# Patient Record
Sex: Female | Born: 1980 | Race: White | Hispanic: No | Marital: Single | Smoking: Current every day smoker
Health system: Southern US, Community
[De-identification: ages and names within clinical notes are randomized; demographics above are authoritative.]

## PROBLEM LIST (undated history)

## (undated) DIAGNOSIS — I509 Heart failure, unspecified: Secondary | ICD-10-CM

## (undated) DIAGNOSIS — Z9114 Patient's other noncompliance with medication regimen: Secondary | ICD-10-CM

## (undated) DIAGNOSIS — Z91148 Patient's other noncompliance with medication regimen for other reason: Secondary | ICD-10-CM

## (undated) DIAGNOSIS — I10 Essential (primary) hypertension: Secondary | ICD-10-CM

## (undated) DIAGNOSIS — D649 Anemia, unspecified: Secondary | ICD-10-CM

## (undated) DIAGNOSIS — J449 Chronic obstructive pulmonary disease, unspecified: Secondary | ICD-10-CM

## (undated) DIAGNOSIS — L309 Dermatitis, unspecified: Secondary | ICD-10-CM

## (undated) HISTORY — DX: Heart failure, unspecified: I50.9

## (undated) HISTORY — DX: Chronic obstructive pulmonary disease, unspecified: J44.9

## (undated) HISTORY — PX: TYMPANOSTOMY TUBE PLACEMENT: SHX32

---

## 2001-02-28 ENCOUNTER — Inpatient Hospital Stay (HOSPITAL_COMMUNITY): Admission: EM | Admit: 2001-02-28 | Discharge: 2001-03-05 | Payer: Self-pay | Admitting: Psychiatry

## 2004-06-29 ENCOUNTER — Inpatient Hospital Stay: Payer: Self-pay

## 2006-01-08 ENCOUNTER — Emergency Department: Payer: Self-pay | Admitting: Emergency Medicine

## 2006-03-21 ENCOUNTER — Emergency Department: Payer: Self-pay | Admitting: General Practice

## 2006-03-22 ENCOUNTER — Emergency Department: Payer: Self-pay | Admitting: General Practice

## 2009-03-08 ENCOUNTER — Emergency Department: Payer: Self-pay | Admitting: Emergency Medicine

## 2011-08-09 ENCOUNTER — Emergency Department: Payer: Self-pay | Admitting: Internal Medicine

## 2016-02-13 ENCOUNTER — Emergency Department
Admission: EM | Admit: 2016-02-13 | Discharge: 2016-02-13 | Disposition: A | Discharge status: Home / Self Care | Payer: Self-pay | Attending: Emergency Medicine | Admitting: Emergency Medicine

## 2016-02-13 ENCOUNTER — Encounter: Payer: Self-pay | Admitting: Emergency Medicine

## 2016-02-13 ENCOUNTER — Emergency Department: Payer: Self-pay

## 2016-02-13 DIAGNOSIS — I10 Essential (primary) hypertension: Secondary | ICD-10-CM | POA: Insufficient documentation

## 2016-02-13 DIAGNOSIS — F17200 Nicotine dependence, unspecified, uncomplicated: Secondary | ICD-10-CM | POA: Insufficient documentation

## 2016-02-13 DIAGNOSIS — J988 Other specified respiratory disorders: Secondary | ICD-10-CM

## 2016-02-13 DIAGNOSIS — J189 Pneumonia, unspecified organism: Secondary | ICD-10-CM | POA: Insufficient documentation

## 2016-02-13 DIAGNOSIS — R062 Wheezing: Secondary | ICD-10-CM | POA: Insufficient documentation

## 2016-02-13 HISTORY — DX: Essential (primary) hypertension: I10

## 2016-02-13 MED ORDER — ALBUTEROL SULFATE HFA 108 (90 BASE) MCG/ACT IN AERS: INHALATION_SPRAY | RESPIRATORY_TRACT | Status: DC

## 2016-02-13 MED ORDER — IPRATROPIUM-ALBUTEROL 0.5-2.5 (3) MG/3ML IN SOLN
3.0000 mL | Freq: Once | RESPIRATORY_TRACT | Status: AC
  Administered 2016-02-13: 3 mL via RESPIRATORY_TRACT
  Filled 2016-02-13: qty 3

## 2016-02-13 MED ORDER — SODIUM CHLORIDE 0.9 % IV BOLUS (SEPSIS)
1000.0000 mL | INTRAVENOUS | Status: AC
  Administered 2016-02-13: 1000 mL via INTRAVENOUS

## 2016-02-13 MED ORDER — LEVOFLOXACIN 750 MG PO TABS: 750.0000 mg | ORAL_TABLET | Freq: Every day | ORAL | Status: AC

## 2016-02-13 NOTE — ED Provider Notes (Signed)
[facility] Emergency Department Provider Note  ____________________________________________  Time seen: Approximately 12:54 PM  I have reviewed the triage vital signs and the nursing notes.   HISTORY  Chief Complaint Fever and Nasal Congestion    HPI Katrina Stout is a 35 y.o. female with a history of daily tobacco use and obesity who presents with several days of nasal congestion, runny nose, mild shortness of breath, worsening cough, and low-grade fever.  She states that the symptoms are moderate in severity and have been gradually getting worse over time.  Nothing makes them better and exertion makes it worse. She has noticed some wheezing at times.  She denies headache, chest pain, abdominal pain, nausea, vomiting, diarrhea, dysuria.  She does not have a formal diagnosis of COPD nor asthma but she also comments that she does not have a regular doctor, has no insurance, and no money to purchase medications.  She is in no acute distress upon arrival although she is tachycardic in the 110s.   Past Medical History  Diagnosis Date  . Hypertension     There are no active problems to display for this patient.   History reviewed. No pertinent past surgical history.  Current Outpatient Rx  Name  Route  Sig  Dispense  Refill  .           Marland Kitchen             Allergies Codeine  History reviewed. No pertinent family history.  Social History Social History  Substance Use Topics  . Smoking status: Current Every Day Smoker  . Smokeless tobacco: None  . Alcohol Use: Yes    Review of Systems Constitutional: low grade fever Eyes: No visual changes. ENT: No sore throat. Cardiovascular: Denies chest pain. Respiratory: Mild shortness of breath and wheezing, cough Gastrointestinal: No abdominal pain.  No nausea, no vomiting.  No diarrhea.  No constipation. Genitourinary: Negative for dysuria. Musculoskeletal: Negative for back pain. Skin: Negative for  rash. Neurological: Negative for headaches, focal weakness or numbness.  10-point ROS otherwise negative.  ____________________________________________   PHYSICAL EXAM:  VITAL SIGNS: ED Triage Vitals  Enc Vitals Group     BP 02/13/16 1040 180/118 mmHg     Pulse Rate 02/13/16 1040 115     Resp 02/13/16 1040 20     Temp 02/13/16 1040 100.8 F (38.2 C)     Temp Source 02/13/16 1040 Oral     SpO2 02/13/16 1040 94 %     Weight 02/13/16 1040 240 lb (108.863 kg)     Height 02/13/16 1040  (1.676 m)     Head Cir --      Peak Flow --      Pain Score 02/13/16 1043 5     Pain Loc --      Pain Edu? --      Excl. in GC? --     Constitutional: Alert and oriented. Well appearing and in no acute distress. Eyes: Conjunctivae are normal. PERRL. EOMI. Head: Atraumatic. Nose: No congestion/rhinnorhea. Mouth/Throat: Mucous membranes are moist.  Oropharynx non-erythematous. Neck: No stridor.  No meningeal signs.   Cardiovascular: Mild tachycardia, regular rhythm. Good peripheral circulation. Grossly normal heart sounds.   Respiratory: Normal respiratory effort.  No retractions. Crackles in bases.  Mild expiratory wheezing. Gastrointestinal: Soft and nontender. No distention.  Musculoskeletal: No lower extremity tenderness nor edema. No gross deformities of extremities. Neurologic:  Normal speech and language. No gross focal neurologic deficits are  appreciated.  Skin:  Skin is warm, dry and intact. No rash noted. Psychiatric: Mood and affect are normal. Speech and behavior are normal.  ____________________________________________   LABS (all labs ordered are listed, but only abnormal results are displayed)  Labs Reviewed  POC URINE PREG, ED   ____________________________________________  EKG  None ____________________________________________  RADIOLOGY   Dg Chest 2 View  02/13/2016  CLINICAL DATA:  Shortness of breath and fever EXAM: CHEST  2 VIEW COMPARISON:  08/09/2011  FINDINGS: Cardiac shadow is stable. The lungs are well aerated bilaterally. Patchy infiltrates are noted within the bases bilaterally projecting in the lingula and right middle lobe. No bony abnormality is seen. IMPRESSION: Patchy infiltrates in the bases bilaterally. Electronically Signed   By: Alcide CleverMark  Lukens M.D.   On: 02/13/2016 12:46    ____________________________________________   PROCEDURES  Procedure(s) performed: None  Critical Care performed: No ____________________________________________   INITIAL IMPRESSION / ASSESSMENT AND PLAN / ED COURSE  Pertinent labs & imaging results that were available during my care of the patient were reviewed by me and considered in my medical decision making (see chart for details).  CAP with wheezing.  No acute distress, no hypoxemia (though in the low 90s).  Felt better after duoneb.  Considered doxycycline vs Levaquin, but with [employer] coupon, she can get a much better deal on the Levaquin, and I want her to have broader spectrum coverage then azithromycin given that she has multilobar involvement.  I offered her a first dose of her Levaquin but explained that this would like to be fairly pricey or she can discuss pickup the prescription from the pharmacy since it is only 1:00 in the afternoon and she prefers the latter.  I gave strict return precautions.  There is no indication for labs at this time because he has no chest pain, does have respiratory infectious symptoms, and has patchy infiltrates on her chest x-ray as well as a heavy tobacco history.  I expect that she will get better soon but she knows to come back if she does not.  She does state that she has a history of hypertension but does not take any medications and I also encouraged her to follow up with the total free number provided on her AVS so that she can establish a primary care doctor or [facility]  directly.   ____________________________________________  FINAL CLINICAL IMPRESSION(S) / ED DIAGNOSES  Final diagnoses:  Community acquired pneumonia  Wheezing-associated respiratory infection (WARI)     MEDICATIONS GIVEN DURING THIS VISIT:  Medications  sodium chloride 0.9 % bolus 1,000 mL (1,000 mLs Intravenous New Bag/Given 02/13/16 1212)     NEW OUTPATIENT MEDICATIONS STARTED DURING THIS VISIT:  New Prescriptions   ALBUTEROL (PROVENTIL HFA;VENTOLIN HFA) 108 (90 BASE) MCG/ACT INHALER    Inhale 2-4 puffs by mouth every 4 hours as needed for wheezing, cough, and/or shortness of breath   LEVOFLOXACIN (LEVAQUIN) 750 MG TABLET    Take 1 tablet (750 mg total) by mouth daily.      Note:  This document was prepared using Dragon voice recognition software and may include unintentional dictation errors.   Loleta Roseory Ailani Governale, MD 02/13/16 [phone]

## 2016-02-13 NOTE — ED Notes (Signed)
Electronic signature pad did not take the signature. Did not print a paper copy from this computer.

## 2016-02-13 NOTE — ED Notes (Signed)
See triage note  States she has had nasal congestion  Runny nose and cough with some fever for the past 2 days   Low grade fever on arrival  Also has not had any B/P meds recently

## 2016-02-13 NOTE — ED Notes (Signed)
Patient unable to void 

## 2016-02-13 NOTE — Discharge Instructions (Signed)
We believe that your symptoms are caused today by pneumonia, an infection in your lung(s).  Fortunately you should start to improve quickly after taking your antibiotics.  Please take the full course of antibiotics as prescribed and drink plenty of fluids.   ° °Follow up with your doctor within 1-2 days.  If you develop any new or worsening symptoms, including but not limited to fever in spite of taking over-the-counter ibuprofen and/or Tylenol, persistent vomiting, worsening shortness of breath, or other symptoms that concern you, please return to the Emergency Department immediately.  ° ° °Pneumonia °Pneumonia is an infection of the lungs.  °CAUSES °Pneumonia may be caused by bacteria or a virus. Usually, these infections are caused by breathing infectious particles into the lungs (respiratory tract). °SIGNS AND SYMPTOMS  °Cough. °Fever. °Chest pain. °Increased rate of breathing. °Wheezing. °Mucus production. °DIAGNOSIS  °If you have the common symptoms of pneumonia, your health care provider will typically confirm the diagnosis with a chest X-ray. The X-ray will show an abnormality in the lung (pulmonary infiltrate) if you have pneumonia. Other tests of your blood, urine, or sputum may be done to find the specific cause of your pneumonia. Your health care provider may also do tests (blood gases or pulse oximetry) to see how well your lungs are working. °TREATMENT  °Some forms of pneumonia may be spread to other people when you cough or sneeze. You may be asked to wear a mask before and during your exam. Pneumonia that is caused by bacteria is treated with antibiotic medicine. Pneumonia that is caused by the influenza virus may be treated with an antiviral medicine. Most other viral infections must run their course. These infections will not respond to antibiotics.  °HOME CARE INSTRUCTIONS  °Cough suppressants may be used if you are losing too much rest. However, coughing protects you by clearing your lungs. You  should avoid using cough suppressants if you can. °Your health care provider may have prescribed medicine if he or she thinks your pneumonia is caused by bacteria or influenza. Finish your medicine even if you start to feel better. °Your health care provider may also prescribe an expectorant. This loosens the mucus to be coughed up. °Take medicines only as directed by your health care provider. °Do not smoke. Smoking is a common cause of bronchitis and can contribute to pneumonia. If you are a smoker and continue to smoke, your cough may last several weeks after your pneumonia has cleared. °A cold steam vaporizer or humidifier in your room or home may help loosen mucus. °Coughing is often worse at night. Sleeping in a semi-upright position in a recliner or using a couple pillows under your head will help with this. °Get rest as you feel it is needed. Your body will usually let you know when you need to rest. °PREVENTION °A pneumococcal shot (vaccine) is available to prevent a common bacterial cause of pneumonia. This is usually suggested for: °People over 65 years old. °Patients on chemotherapy. °People with chronic lung problems, such as bronchitis or emphysema. °People with immune system problems. °If you are over 65 or have a high risk condition, you may receive the pneumococcal vaccine if you have not received it before. In some countries, a routine influenza vaccine is also recommended. This vaccine can help prevent some cases of pneumonia. You may be offered the influenza vaccine as part of your care. °If you smoke, it is time to quit. You may receive instructions on how to stop smoking. Your   health care provider can provide medicines and counseling to help you quit. °SEEK MEDICAL CARE IF: °You have a fever. °SEEK IMMEDIATE MEDICAL CARE IF:  °Your illness becomes worse. This is especially true if you are elderly or weakened from any other disease. °You cannot control your cough with suppressants and are losing  sleep. °You begin coughing up blood. °You develop pain which is getting worse or is uncontrolled with medicines. °Any of the symptoms which initially brought you in for treatment are getting worse rather than better. °You develop shortness of breath or chest pain. °MAKE SURE YOU:  °Understand these instructions. °Will watch your condition. °Will get help right away if you are not doing well or get worse. °Document Released: 09/07/2005 Document Revised: 01/22/2014 Document Reviewed: 11/27/2010 °[employer]® Patient Information ©2015 [employer]. This information is not intended to replace advice given to you by your health care provider. Make sure you discuss any questions you have with your health care provider. ° ° ° °

## 2016-02-13 NOTE — ED Notes (Addendum)
Pt to ed with c/o runny nose, fever, congestion x 2 days. Pt bp elevated at triage,  States she does not have insurance and has not been to see a dr recently.  Reports she has been told in the past that she has HTN.

## 2016-08-13 ENCOUNTER — Emergency Department: Payer: Self-pay

## 2016-08-13 ENCOUNTER — Emergency Department
Admission: EM | Admit: 2016-08-13 | Discharge: 2016-08-13 | Disposition: A | Discharge status: Home / Self Care | Payer: Self-pay | Attending: Emergency Medicine | Admitting: Emergency Medicine

## 2016-08-13 ENCOUNTER — Encounter: Payer: Self-pay | Admitting: Emergency Medicine

## 2016-08-13 DIAGNOSIS — J189 Pneumonia, unspecified organism: Secondary | ICD-10-CM

## 2016-08-13 DIAGNOSIS — J181 Lobar pneumonia, unspecified organism: Secondary | ICD-10-CM | POA: Insufficient documentation

## 2016-08-13 DIAGNOSIS — F17200 Nicotine dependence, unspecified, uncomplicated: Secondary | ICD-10-CM | POA: Insufficient documentation

## 2016-08-13 DIAGNOSIS — R0602 Shortness of breath: Secondary | ICD-10-CM

## 2016-08-13 DIAGNOSIS — I10 Essential (primary) hypertension: Secondary | ICD-10-CM | POA: Insufficient documentation

## 2016-08-13 LAB — BASIC METABOLIC PANEL
Anion gap: 7 (ref 5–15)
BUN: 14 mg/dL (ref 6–20)
CHLORIDE: 105 mmol/L (ref 101–111)
CO2: 25 mmol/L (ref 22–32)
CREATININE: 0.91 mg/dL (ref 0.44–1.00)
Calcium: 8.7 mg/dL — ABNORMAL LOW (ref 8.9–10.3)
GFR calc Af Amer: >60 mL/min (ref >60)
GFR calc non Af Amer: >60 mL/min (ref >60)
Glucose, Bld: 114 mg/dL — ABNORMAL HIGH (ref 65–99)
Potassium: 3.6 mmol/L (ref 3.5–5.1)
SODIUM: 137 mmol/L (ref 135–145)

## 2016-08-13 LAB — CBC
HEMATOCRIT: 33.3 % — AB (ref 35.0–47.0)
Hemoglobin: 10.9 g/dL — ABNORMAL LOW (ref 12.0–16.0)
MCH: 27.1 pg (ref 26.0–34.0)
MCHC: 32.9 g/dL (ref 32.0–36.0)
MCV: 82.5 fL (ref 80.0–100.0)
PLATELETS: 254 10*3/uL (ref 150–440)
RBC: 4.03 MIL/uL (ref 3.80–5.20)
RDW: 16.6 % — AB (ref 11.5–14.5)
WBC: 12.7 10*3/uL — ABNORMAL HIGH (ref 3.6–11.0)

## 2016-08-13 LAB — TROPONIN I

## 2016-08-13 MED ORDER — ACETAMINOPHEN 325 MG PO TABS
650.0000 mg | ORAL_TABLET | Freq: Once | ORAL | Status: AC
  Administered 2016-08-13: 650 mg via ORAL
  Filled 2016-08-13: qty 2

## 2016-08-13 MED ORDER — IPRATROPIUM-ALBUTEROL 0.5-2.5 (3) MG/3ML IN SOLN
3.0000 mL | Freq: Once | RESPIRATORY_TRACT | Status: AC
  Administered 2016-08-13: 3 mL via RESPIRATORY_TRACT
  Filled 2016-08-13: qty 3

## 2016-08-13 MED ORDER — LEVOFLOXACIN 750 MG PO TABS
750.0000 mg | ORAL_TABLET | Freq: Once | ORAL | Status: AC
  Administered 2016-08-13: 750 mg via ORAL
  Filled 2016-08-13: qty 1

## 2016-08-13 MED ORDER — LEVOFLOXACIN 750 MG PO TABS: 750.0000 mg | ORAL_TABLET | Freq: Every day | ORAL | 0 refills | Status: AC

## 2016-08-13 NOTE — Discharge Instructions (Signed)
Please take your antibiotics as prescribed. Please follow-up with the primary care doctor in the next 1 week for recheck/reevaluation. Return to the emergency department for any increased chest pain, or any trouble breathing.

## 2016-08-13 NOTE — ED Provider Notes (Signed)
[facility] Emergency Department Provider Note  Time seen: 10:57 PM  I have reviewed the triage vital signs and the nursing notes.   HISTORY  Chief Complaint Shortness of Breath    HPI Katrina Stout is a 35 y.o. female with a past medical history of hypertension who presents the emergency department for shortness of breath. According to the patient for the past 2-3 days she has been feeling very short of breath especially with exertion. States she has been coughing. States subjective fever. Denies any chest pain. Denies any pleuritic pain. Denies any leg pain or swelling.Describes her shortness of breath is mild, moderate with exertion.  Past Medical History:  Diagnosis Date  . Hypertension     There are no active problems to display for this patient.   History reviewed. No pertinent surgical history.  Prior to Admission medications   Medication Sig Start Date End Date Taking? Authorizing Provider  albuterol (PROVENTIL HFA;VENTOLIN HFA) 108 (90 Base) MCG/ACT inhaler Inhale 2-4 puffs by mouth every 4 hours as needed for wheezing, cough, and/or shortness of breath 02/13/16   Loleta Roseory Forbach, MD    No Active Allergies  No family history on file.  Social History Social History  Substance Use Topics  . Smoking status: Current Every Day Smoker  . Smokeless tobacco: Never Used  . Alcohol use Yes    Review of Systems Constitutional: Negative for fever. Cardiovascular: Negative for chest pain. Respiratory: Positive for shortness of breath. Positive for cough. Gastrointestinal: Negative for abdominal pain Musculoskeletal: Negative for back pain. Neurological: Negative for headache 10-point ROS otherwise negative.  ____________________________________________   PHYSICAL EXAM:  VITAL SIGNS: ED Triage Vitals  Enc Vitals Group     BP 08/13/16 1935 (!) 195/97     Pulse Rate 08/13/16 1935 (!) 106     Resp 08/13/16 1935 20     Temp 08/13/16 1935  99.2 F (37.3 C)     Temp Source 08/13/16 1935 Oral     SpO2 08/13/16 1935 95 %     Weight 08/13/16 1936 245 lb (111.1 kg)     Height 08/13/16 1936 5\' 6"  (1.676 m)     Head Circumference --      Peak Flow --      Pain Score 08/13/16 2201 0     Pain Loc --      Pain Edu? --      Excl. in GC? --     Constitutional: Alert and oriented. Well appearing and in no distress. Eyes: Normal exam ENT   Head: Normocephalic and atraumatic   Mouth/Throat: Mucous membranes are moist. Cardiovascular: Normal rate, regular rhythm around 90 bpm. No murmur Respiratory: Normal respiratory effort without tachypnea nor retractions. Breath sounds are clear. No obvious wheezes/rales/rhonchi. Gastrointestinal: Soft and nontender. No distention.   Musculoskeletal: Nontender with normal range of motion in all extremities. No lower extremity tenderness or edema. Neurologic:  Normal speech and language. No gross focal neurologic deficits Skin:  Skin is warm, dry and intact.  Psychiatric: Mood and affect are normal.   ____________________________________________    EKG  EKG reviewed and interpreted by myself shows sinus tachycardia 104 bpm, narrow QRS, left axis deviation, normal intervals, nonspecific ST changes. No ST elevation.  ____________________________________________    RADIOLOGY  Chest x-ray shows patchy opacity over the right mid lung.  ____________________________________________   INITIAL IMPRESSION / ASSESSMENT AND PLAN / ED COURSE  Pertinent labs & imaging results that were available during my care  of the patient were reviewed by me and considered in my medical decision making (see chart for details).  The patient presents to the emergency department with difficulty breathing, and cough for the last 3 days. Patient has an active cough in the emergency department. Overall clear lung sounds, nontender abdomen. Patient has a low-grade temperature 99.2, states it has been higher at  home. Chest x-ray consistent with right middle lobe pneumonia. Given the chest x-ray findings we will treat with Levaquin for the next 7 days, dose Tylenol and a DuoNeb for symptom relief in the emergency department. Patient is agreeable to plan.  ____________________________________________   FINAL CLINICAL IMPRESSION(S) / ED DIAGNOSES  Pneumonia    Minna AntisKevin Joelle [place], MD 08/13/16 2300

## 2016-08-13 NOTE — ED Triage Notes (Signed)
Pt presents to ED with c/o shortness of breath and dizziness since last night. Pt is current smoker. Pt reports coughing when laying flat. Coughing up clear sputum. Pt denies chest pain, abd pain, or headache.

## 2016-09-19 ENCOUNTER — Emergency Department
Admission: EM | Admit: 2016-09-19 | Discharge: 2016-09-19 | Disposition: A | Discharge status: Home / Self Care | Payer: Self-pay | Attending: Emergency Medicine | Admitting: Emergency Medicine

## 2016-09-19 ENCOUNTER — Encounter: Payer: Self-pay | Admitting: Emergency Medicine

## 2016-09-19 DIAGNOSIS — I10 Essential (primary) hypertension: Secondary | ICD-10-CM | POA: Insufficient documentation

## 2016-09-19 DIAGNOSIS — R609 Edema, unspecified: Secondary | ICD-10-CM | POA: Insufficient documentation

## 2016-09-19 DIAGNOSIS — F17200 Nicotine dependence, unspecified, uncomplicated: Secondary | ICD-10-CM | POA: Insufficient documentation

## 2016-09-19 LAB — COMPREHENSIVE METABOLIC PANEL
ALT: 21 U/L (ref 14–54)
ANION GAP: 7 (ref 5–15)
AST: 28 U/L (ref 15–41)
Albumin: 3.7 g/dL (ref 3.5–5.0)
Alkaline Phosphatase: 63 U/L (ref 38–126)
BUN: 16 mg/dL (ref 6–20)
CHLORIDE: 108 mmol/L (ref 101–111)
CO2: 25 mmol/L (ref 22–32)
Calcium: 8.8 mg/dL — ABNORMAL LOW (ref 8.9–10.3)
Creatinine, Ser: 0.7 mg/dL (ref 0.44–1.00)
GFR calc Af Amer: >60 mL/min (ref >60)
Glucose, Bld: 112 mg/dL — ABNORMAL HIGH (ref 65–99)
POTASSIUM: 3.4 mmol/L — AB (ref 3.5–5.1)
Sodium: 140 mmol/L (ref 135–145)
Total Bilirubin: 0.4 mg/dL (ref 0.3–1.2)
Total Protein: 6.9 g/dL (ref 6.5–8.1)

## 2016-09-19 LAB — CBC WITH DIFFERENTIAL/PLATELET
BASOS ABS: 0 10*3/uL (ref 0–0.1)
BASOS PCT: 1 %
EOS PCT: 3 %
Eosinophils Absolute: 0.3 10*3/uL (ref 0–0.7)
HCT: 34.3 % — ABNORMAL LOW (ref 35.0–47.0)
Hemoglobin: 11.2 g/dL — ABNORMAL LOW (ref 12.0–16.0)
LYMPHS PCT: 23 %
Lymphs Abs: 2.1 10*3/uL (ref 1.0–3.6)
MCH: 26.5 pg (ref 26.0–34.0)
MCHC: 32.6 g/dL (ref 32.0–36.0)
MCV: 81.2 fL (ref 80.0–100.0)
MONO ABS: 0.5 10*3/uL (ref 0.2–0.9)
Monocytes Relative: 6 %
Neutro Abs: 6.3 10*3/uL (ref 1.4–6.5)
Neutrophils Relative %: 67 %
PLATELETS: 299 10*3/uL (ref 150–440)
RBC: 4.22 MIL/uL (ref 3.80–5.20)
RDW: 16.7 % — AB (ref 11.5–14.5)
WBC: 9.2 10*3/uL (ref 3.6–11.0)

## 2016-09-19 LAB — TROPONIN I
TROPONIN I: 0.04 ng/mL — AB (ref <0.03)
TROPONIN I: 0.03 ng/mL — AB (ref <0.03)

## 2016-09-19 MED ORDER — FUROSEMIDE 10 MG/ML IJ SOLN
40.0000 mg | Freq: Once | INTRAMUSCULAR | Status: AC
Start: 2016-09-19 — End: 2016-09-19
  Administered 2016-09-19: 40 mg via INTRAVENOUS
  Filled 2016-09-19: qty 4

## 2016-09-19 MED ORDER — CLONIDINE HCL 0.2 MG PO TABS: 0.2000 mg | ORAL_TABLET | Freq: Two times a day (BID) | ORAL | 0 refills | Status: AC | PRN

## 2016-09-19 MED ORDER — FUROSEMIDE 20 MG PO TABS: 20.0000 mg | ORAL_TABLET | Freq: Every day | ORAL | 0 refills | Status: AC

## 2016-09-19 NOTE — ED Triage Notes (Signed)
Pt states she has had feet edema since christmas. Pt states she has her feet in dependent position. Pt denies shob, pain. Pt states she was shaving her legs last pm when she felt like her right leg was tingling. Pt with 2+ pedal edema noted.

## 2016-09-19 NOTE — ED Provider Notes (Signed)
Time Seen: Approximately 2215  I have reviewed the triage notes  Chief Complaint: Foot Swelling   History of Present Illness: Katrina Stout is a 35 y.o. female *who presents with some bilateral peripheral edema that is been increasing since Christmas. The patient has not had any current treatment for hypertension. She denies any chest pain or shortness of breath. She denies any nausea, vomiting or diaphoresis. She currently has no primary physician at this time. The patient states she is not pregnant nor currently has any risk of being pregnant.   Past Medical History:  Diagnosis Date  . Hypertension     There are no active problems to display for this patient.   History reviewed. No pertinent surgical history.  History reviewed. No pertinent surgical history.  Current Outpatient Rx  . Order #: [id number] Class: Historical Med  . Order #: [id number] Class: Print    Allergies:  Patient has no known allergies.  Family History: History reviewed. No pertinent family history.  Social History: Social History  Substance Use Topics  . Smoking status: Current Every Day Smoker  . Smokeless tobacco: Never Used  . Alcohol use Yes     Review of Systems:   10 point review of systems was performed and was otherwise negative:  Constitutional: No fever Eyes: No visual disturbances ENT: No sore throat, ear pain Cardiac: No chest pain Respiratory: No shortness of breath, wheezing, or stridor Abdomen: No abdominal pain, no vomiting, No diarrhea Endocrine: No weight loss, No night sweats Extremities: Bilateral peripheral edema Skin: No rashes, easy bruising Neurologic: No focal weakness, trouble with speech or swollowing Urologic: No dysuria, Hematuria, or urinary frequency   Physical Exam:  ED Triage Vitals [09/19/16 2118]  Enc Vitals Group     BP (!) 215/99     Pulse Rate 94     Resp 16     Temp 98.3 F (36.8 C)     Temp src      SpO2 100 %     Weight 240 lb  (108.9 kg)     Height 5\' 6"  (1.676 m)     Head Circumference      Peak Flow      Pain Score      Pain Loc      Pain Edu?      Excl. in GC?     General: Awake , Alert , and Oriented times 3; GCS 15 Head: Normal cephalic , atraumatic Eyes: Pupils equal , round, reactive to light Nose/Throat: No nasal drainage, patent upper airway without erythema or exudate.  Neck: Supple, Full range of motion, No anterior adenopathy or palpable thyroid masses Lungs: Clear to ascultation without wheezes , rhonchi, or rales Heart: Regular rate, regular rhythm without murmurs , gallops , or rubs Abdomen: Soft, non tender without rebound, guarding , or rigidity; bowel sounds positive and symmetric in all 4 quadrants. No organomegaly .        Extremities: Patient has circumferential bilateral edema without any associated cellulitis, calf tenderness. Negative Homans sign  Neurologic: normal ambulation, Motor symmetric without deficits, sensory intact Skin: warm, dry, no rashes   Labs:   All laboratory work was reviewed including any pertinent negatives or positives listed below:  Labs Reviewed  CBC WITH DIFFERENTIAL/PLATELET - Abnormal; Notable for the following:       Result Value   Hemoglobin 11.2 (*)    HCT 34.3 (*)    RDW 16.7 (*)    All other components within normal  limits  COMPREHENSIVE METABOLIC PANEL - Abnormal; Notable for the following:    Potassium 3.4 (*)    Glucose, Bld 112 (*)    Calcium 8.8 (*)    All other components within normal limits  TROPONIN I - Abnormal; Notable for the following:    Troponin I 0.04 (*)    All other components within normal limits  TROPONIN I    EKG: ED ECG REPORT I, Jennye MoccasinBrian S Nayali Talerico, the attending physician, personally viewed and interpreted this ECG.  Date: 09/19/2016 EKG Time: 2137 Rate: 90Rhythm: normal sinus rhythm QRS Axis: normal Intervals: normal ST/T Wave abnormalities: normal Conduction Disturbances: none Narrative Interpretation:  unremarkable Left atrial enlargement Left ventricular hypertrophy  ED Course:  The patient presents with hypertension and bilateral peripheral edema which I felt were related. Her renal function appears to be within normal limits and the patient was started on IV diuretic therapy and will be discharged on blood pressure medication with the diuretic therapy and advised to follow up with a primary physician. She states she does not currently have any primary care in the area. I felt that tire was unlikely that she has bilateral deep venous thrombosis, etc. Her troponin is only slightly elevated that felt this may be secondary to some left heart strain and felt it was not a level of significance nor clinically did the patient presented with acute coronary syndrome. She also has minimal risk factors based on her age, etc. Clinical Course      Assessment: * Peripheral edema Hypertension     Plan:  Outpatient Patient was advised to return immediately if condition worsens. Patient was advised to follow up with their primary care physician or other specialized physicians involved in their outpatient care. The patient and/or family member/power of attorney had laboratory results reviewed at the bedside. All questions and concerns were addressed and appropriate discharge instructions were distributed by the nursing staff.            Jennye MoccasinBrian S Cynthie Garmon, MD 09/19/16 [phone]

## 2016-09-19 NOTE — Discharge Instructions (Signed)
Please consult foods that have high potassium such as bananas and orange juice as the Lasix which is designed to decrease the fluid in your legs and help with her blood pressure can decrease your potassium levels. He will need follow-up with a primary physician. The clonidine is only to be taken as needed when your blood pressure exceeds 150 systolic or 100 diastolic. Please try to decrease the salt in your diet and very light exercise.  Please return immediately if condition worsens. Please contact her primary physician or the physician you were given for referral. If you have any specialist physicians involved in her treatment and plan please also contact them. Thank you for using [facility].

## 2016-10-22 ENCOUNTER — Emergency Department: Payer: Self-pay

## 2016-10-22 ENCOUNTER — Encounter: Payer: Self-pay | Admitting: *Deleted

## 2016-10-22 ENCOUNTER — Inpatient Hospital Stay
Admission: EM | Admit: 2016-10-22 | Discharge: 2016-10-24 | DRG: 291 | Disposition: A | Discharge status: Home / Self Care | Payer: Self-pay | Attending: Internal Medicine | Admitting: Internal Medicine

## 2016-10-22 DIAGNOSIS — Z833 Family history of diabetes mellitus: Secondary | ICD-10-CM

## 2016-10-22 DIAGNOSIS — J209 Acute bronchitis, unspecified: Secondary | ICD-10-CM | POA: Diagnosis present

## 2016-10-22 DIAGNOSIS — M7989 Other specified soft tissue disorders: Secondary | ICD-10-CM

## 2016-10-22 DIAGNOSIS — R0682 Tachypnea, not elsewhere classified: Secondary | ICD-10-CM | POA: Diagnosis present

## 2016-10-22 DIAGNOSIS — Z8249 Family history of ischemic heart disease and other diseases of the circulatory system: Secondary | ICD-10-CM

## 2016-10-22 DIAGNOSIS — I5031 Acute diastolic (congestive) heart failure: Secondary | ICD-10-CM | POA: Diagnosis present

## 2016-10-22 DIAGNOSIS — J40 Bronchitis, not specified as acute or chronic: Secondary | ICD-10-CM | POA: Diagnosis present

## 2016-10-22 DIAGNOSIS — J440 Chronic obstructive pulmonary disease with acute lower respiratory infection: Secondary | ICD-10-CM | POA: Diagnosis present

## 2016-10-22 DIAGNOSIS — I160 Hypertensive urgency: Secondary | ICD-10-CM | POA: Diagnosis present

## 2016-10-22 DIAGNOSIS — R0902 Hypoxemia: Secondary | ICD-10-CM | POA: Diagnosis present

## 2016-10-22 DIAGNOSIS — Z6841 Body Mass Index (BMI) 40.0 and over, adult: Secondary | ICD-10-CM

## 2016-10-22 DIAGNOSIS — I110 Hypertensive heart disease with heart failure: Principal | ICD-10-CM | POA: Diagnosis present

## 2016-10-22 DIAGNOSIS — E876 Hypokalemia: Secondary | ICD-10-CM | POA: Diagnosis present

## 2016-10-22 DIAGNOSIS — J189 Pneumonia, unspecified organism: Secondary | ICD-10-CM

## 2016-10-22 DIAGNOSIS — J810 Acute pulmonary edema: Secondary | ICD-10-CM

## 2016-10-22 DIAGNOSIS — I5033 Acute on chronic diastolic (congestive) heart failure: Secondary | ICD-10-CM | POA: Diagnosis present

## 2016-10-22 DIAGNOSIS — R7989 Other specified abnormal findings of blood chemistry: Secondary | ICD-10-CM

## 2016-10-22 DIAGNOSIS — F17210 Nicotine dependence, cigarettes, uncomplicated: Secondary | ICD-10-CM | POA: Diagnosis present

## 2016-10-22 DIAGNOSIS — R000 Tachycardia, unspecified: Secondary | ICD-10-CM

## 2016-10-22 DIAGNOSIS — Z79899 Other long term (current) drug therapy: Secondary | ICD-10-CM

## 2016-10-22 DIAGNOSIS — J441 Chronic obstructive pulmonary disease with (acute) exacerbation: Secondary | ICD-10-CM | POA: Diagnosis present

## 2016-10-22 DIAGNOSIS — Z9889 Other specified postprocedural states: Secondary | ICD-10-CM

## 2016-10-22 DIAGNOSIS — J9601 Acute respiratory failure with hypoxia: Secondary | ICD-10-CM | POA: Diagnosis present

## 2016-10-22 DIAGNOSIS — R0602 Shortness of breath: Secondary | ICD-10-CM

## 2016-10-22 DIAGNOSIS — I248 Other forms of acute ischemic heart disease: Secondary | ICD-10-CM | POA: Diagnosis present

## 2016-10-22 DIAGNOSIS — R0603 Acute respiratory distress: Secondary | ICD-10-CM | POA: Diagnosis present

## 2016-10-22 DIAGNOSIS — G4730 Sleep apnea, unspecified: Secondary | ICD-10-CM | POA: Diagnosis present

## 2016-10-22 HISTORY — DX: Anemia, unspecified: D64.9

## 2016-10-22 HISTORY — DX: Patient's other noncompliance with medication regimen: Z91.14

## 2016-10-22 HISTORY — DX: Patient's other noncompliance with medication regimen for other reason: Z91.148

## 2016-10-22 HISTORY — DX: Dermatitis, unspecified: L30.9

## 2016-10-22 HISTORY — DX: Morbid (severe) obesity due to excess calories: E66.01

## 2016-10-22 LAB — INFLUENZA PANEL BY PCR (TYPE A & B)
Influenza A By PCR: NEGATIVE
Influenza B By PCR: NEGATIVE

## 2016-10-22 LAB — BASIC METABOLIC PANEL
Anion gap: 9 (ref 5–15)
BUN: 11 mg/dL (ref 6–20)
CALCIUM: 8.5 mg/dL — AB (ref 8.9–10.3)
CO2: 25 mmol/L (ref 22–32)
Chloride: 106 mmol/L (ref 101–111)
Creatinine, Ser: 0.63 mg/dL (ref 0.44–1.00)
GFR calc Af Amer: >60 mL/min (ref >60)
GLUCOSE: 107 mg/dL — AB (ref 65–99)
POTASSIUM: 3.8 mmol/L (ref 3.5–5.1)
SODIUM: 140 mmol/L (ref 135–145)

## 2016-10-22 LAB — CBC
HEMATOCRIT: 36 % (ref 35.0–47.0)
Hemoglobin: 11.4 g/dL — ABNORMAL LOW (ref 12.0–16.0)
MCH: 26 pg (ref 26.0–34.0)
MCHC: 31.8 g/dL — AB (ref 32.0–36.0)
MCV: 81.7 fL (ref 80.0–100.0)
Platelets: 292 10*3/uL (ref 150–440)
RBC: 4.41 MIL/uL (ref 3.80–5.20)
RDW: 16 % — AB (ref 11.5–14.5)
WBC: 12.8 10*3/uL — AB (ref 3.6–11.0)

## 2016-10-22 LAB — TROPONIN I
Troponin I: <0.03 ng/mL (ref <0.03)
Troponin I: <0.03 ng/mL (ref <0.03)

## 2016-10-22 LAB — BRAIN NATRIURETIC PEPTIDE: B Natriuretic Peptide: 198 pg/mL — ABNORMAL HIGH (ref 0.0–100.0)

## 2016-10-22 MED ORDER — ACETAMINOPHEN 325 MG PO TABS
650.0000 mg | ORAL_TABLET | Freq: Four times a day (QID) | ORAL | Status: DC | PRN
  Administered 2016-10-23: 650 mg via ORAL
  Filled 2016-10-22: qty 2

## 2016-10-22 MED ORDER — DOCUSATE SODIUM 100 MG PO CAPS
100.0000 mg | ORAL_CAPSULE | Freq: Two times a day (BID) | ORAL | Status: DC
  Administered 2016-10-23 – 2016-10-24 (×3): 100 mg via ORAL
  Filled 2016-10-22 (×3): qty 1

## 2016-10-22 MED ORDER — IPRATROPIUM-ALBUTEROL 0.5-2.5 (3) MG/3ML IN SOLN
3.0000 mL | Freq: Once | RESPIRATORY_TRACT | Status: AC
  Administered 2016-10-22: 3 mL via RESPIRATORY_TRACT
  Filled 2016-10-22: qty 3

## 2016-10-22 MED ORDER — IPRATROPIUM-ALBUTEROL 0.5-2.5 (3) MG/3ML IN SOLN
3.0000 mL | RESPIRATORY_TRACT | Status: DC
  Administered 2016-10-23 – 2016-10-24 (×9): 3 mL via RESPIRATORY_TRACT
  Filled 2016-10-22 (×11): qty 3

## 2016-10-22 MED ORDER — FUROSEMIDE 10 MG/ML IJ SOLN
40.0000 mg | INTRAMUSCULAR | Status: AC
  Administered 2016-10-22: 40 mg via INTRAVENOUS
  Filled 2016-10-22: qty 4

## 2016-10-22 MED ORDER — ONDANSETRON HCL 4 MG PO TABS
4.0000 mg | ORAL_TABLET | Freq: Four times a day (QID) | ORAL | Status: DC | PRN
Start: 2016-10-22 — End: 2016-10-24

## 2016-10-22 MED ORDER — FUROSEMIDE 10 MG/ML IJ SOLN
40.0000 mg | Freq: Two times a day (BID) | INTRAMUSCULAR | Status: DC
  Administered 2016-10-23 – 2016-10-24 (×3): 40 mg via INTRAVENOUS
  Filled 2016-10-22 (×3): qty 4

## 2016-10-22 MED ORDER — SODIUM CHLORIDE 0.9% FLUSH
3.0000 mL | Freq: Two times a day (BID) | INTRAVENOUS | Status: DC
  Administered 2016-10-22 – 2016-10-24 (×4): 3 mL via INTRAVENOUS

## 2016-10-22 MED ORDER — FUROSEMIDE 10 MG/ML IJ SOLN
20.0000 mg | Freq: Once | INTRAMUSCULAR | Status: AC
  Administered 2016-10-22: 20 mg via INTRAVENOUS
  Filled 2016-10-22: qty 4

## 2016-10-22 MED ORDER — KETOROLAC TROMETHAMINE 30 MG/ML IJ SOLN
30.0000 mg | Freq: Once | INTRAMUSCULAR | Status: AC
  Administered 2016-10-22: 30 mg via INTRAVENOUS
  Filled 2016-10-22: qty 1

## 2016-10-22 MED ORDER — HYDRALAZINE HCL 20 MG/ML IJ SOLN
10.0000 mg | Freq: Four times a day (QID) | INTRAMUSCULAR | Status: DC | PRN
  Administered 2016-10-23 – 2016-10-24 (×3): 10 mg via INTRAVENOUS
  Filled 2016-10-22 (×3): qty 1

## 2016-10-22 MED ORDER — ENOXAPARIN SODIUM 40 MG/0.4ML ~~LOC~~ SOLN
40.0000 mg | Freq: Two times a day (BID) | SUBCUTANEOUS | Status: DC
  Administered 2016-10-22 – 2016-10-24 (×4): 40 mg via SUBCUTANEOUS
  Filled 2016-10-22 (×4): qty 0.4

## 2016-10-22 MED ORDER — NICOTINE 21 MG/24HR TD PT24
21.0000 mg | MEDICATED_PATCH | Freq: Every day | TRANSDERMAL | Status: DC
  Administered 2016-10-22 – 2016-10-24 (×3): 21 mg via TRANSDERMAL
  Filled 2016-10-22 (×3): qty 1

## 2016-10-22 MED ORDER — CLONIDINE HCL 0.1 MG PO TABS: 0.2000 mg | ORAL_TABLET | Freq: Two times a day (BID) | ORAL | Status: DC | PRN

## 2016-10-22 MED ORDER — DEXTROSE 5 % IV SOLN: 1.0000 g | Freq: Once | INTRAVENOUS | Status: DC

## 2016-10-22 MED ORDER — ORAL CARE MOUTH RINSE
15.0000 mL | Freq: Two times a day (BID) | OROMUCOSAL | Status: DC
  Administered 2016-10-22 – 2016-10-24 (×3): 15 mL via OROMUCOSAL

## 2016-10-22 MED ORDER — ALBUTEROL SULFATE (2.5 MG/3ML) 0.083% IN NEBU
5.0000 mg | INHALATION_SOLUTION | Freq: Once | RESPIRATORY_TRACT | Status: AC
  Administered 2016-10-22: 5 mg via RESPIRATORY_TRACT

## 2016-10-22 MED ORDER — CLONIDINE HCL 0.1 MG PO TABS
0.2000 mg | ORAL_TABLET | Freq: Once | ORAL | Status: AC
  Administered 2016-10-22: 0.2 mg via ORAL
  Filled 2016-10-22: qty 2

## 2016-10-22 MED ORDER — CEFTRIAXONE SODIUM-DEXTROSE 1-3.74 GM-% IV SOLR
1.0000 g | Freq: Once | INTRAVENOUS | Status: AC
  Administered 2016-10-22: 1 g via INTRAVENOUS
  Filled 2016-10-22: qty 50

## 2016-10-22 MED ORDER — ONDANSETRON HCL 4 MG/2ML IJ SOLN: 4.0000 mg | Freq: Four times a day (QID) | INTRAMUSCULAR | Status: DC | PRN

## 2016-10-22 MED ORDER — ASPIRIN 81 MG PO CHEW
81.0000 mg | CHEWABLE_TABLET | Freq: Every day | ORAL | Status: DC
  Administered 2016-10-22 – 2016-10-24 (×3): 81 mg via ORAL
  Filled 2016-10-22 (×3): qty 1

## 2016-10-22 MED ORDER — METHYLPREDNISOLONE SODIUM SUCC 40 MG IJ SOLR
40.0000 mg | Freq: Every day | INTRAMUSCULAR | Status: DC
  Administered 2016-10-22 – 2016-10-23 (×2): 40 mg via INTRAVENOUS
  Filled 2016-10-22 (×2): qty 1

## 2016-10-22 MED ORDER — ACETAMINOPHEN 650 MG RE SUPP: 650.0000 mg | Freq: Four times a day (QID) | RECTAL | Status: DC | PRN

## 2016-10-22 MED ORDER — INFLUENZA VAC SPLIT QUAD 0.5 ML IM SUSY
0.5000 mL | PREFILLED_SYRINGE | INTRAMUSCULAR | Status: AC
  Administered 2016-10-24: 0.5 mL via INTRAMUSCULAR
  Filled 2016-10-22: qty 0.5

## 2016-10-22 MED ORDER — CARVEDILOL 6.25 MG PO TABS
6.2500 mg | ORAL_TABLET | Freq: Two times a day (BID) | ORAL | Status: DC
  Administered 2016-10-23: 6.25 mg via ORAL
  Filled 2016-10-22: qty 1

## 2016-10-22 MED ORDER — AZITHROMYCIN 500 MG PO TABS
500.0000 mg | ORAL_TABLET | Freq: Once | ORAL | Status: AC
  Administered 2016-10-22: 500 mg via ORAL
  Filled 2016-10-22: qty 1

## 2016-10-22 MED ORDER — ALBUTEROL SULFATE (2.5 MG/3ML) 0.083% IN NEBU
INHALATION_SOLUTION | RESPIRATORY_TRACT | Status: AC
  Administered 2016-10-22: 5 mg via RESPIRATORY_TRACT
  Filled 2016-10-22: qty 6

## 2016-10-22 MED ORDER — BACITRACIN-NEOMYCIN-POLYMYXIN OINTMENT TUBE
TOPICAL_OINTMENT | Freq: Two times a day (BID) | CUTANEOUS | Status: DC
  Administered 2016-10-22: 23:00:00 via TOPICAL
  Administered 2016-10-23: 1 via TOPICAL
  Administered 2016-10-23 – 2016-10-24 (×2): via TOPICAL
  Filled 2016-10-22: qty 14.17

## 2016-10-22 MED ORDER — LEVOFLOXACIN 500 MG PO TABS
500.0000 mg | ORAL_TABLET | Freq: Every day | ORAL | Status: DC
Start: 2016-10-23 — End: 2016-10-24
  Administered 2016-10-23: 500 mg via ORAL
  Filled 2016-10-22: qty 1

## 2016-10-22 MED ORDER — PREDNISONE 20 MG PO TABS
60.0000 mg | ORAL_TABLET | Freq: Once | ORAL | Status: AC
  Administered 2016-10-22: 60 mg via ORAL
  Filled 2016-10-22: qty 3

## 2016-10-22 MED ORDER — POLYETHYLENE GLYCOL 3350 17 G PO PACK: 17.0000 g | PACK | Freq: Every day | ORAL | Status: DC | PRN

## 2016-10-22 NOTE — ED Notes (Signed)
Called floor to let them know patient on the way 

## 2016-10-22 NOTE — Progress Notes (Signed)
Anticoagulation monitoring(Lovenox):  36 yo female ordered Lovenox 40 mg Q24h  Filed Weights   10/22/16 1623  Weight: 250 lb (113.4 kg)   BMI 40.4    Lab Results  Component Value Date   CREATININE 0.63 10/22/2016   CREATININE 0.70 09/19/2016   CREATININE 0.91 08/13/2016   Estimated Creatinine Clearance: 123 mL/min (by C-G formula based on SCr of 0.63 mg/dL). Hemoglobin & Hematocrit     Component Value Date/Time   HGB 11.4 (L) 10/22/2016 1634   HCT 36.0 10/22/2016 1634     Per Protocol for Patient with estCrcl > 30 ml/min and BMI > 40, will transition to Lovenox 40 mg Q12h.

## 2016-10-22 NOTE — H&P (Signed)
[facility] - [place] at [facility]   PATIENT NAME: Katrina Stout    MR#:  [id number]  DATE OF BIRTH:  January 14, 1981  DATE OF ADMISSION:  10/22/2016  PRIMARY CARE PHYSICIAN: No PCP Per Patient   REQUESTING/REFERRING PHYSICIAN: Dr. Cecil Cobbsaroline Veronese  CHIEF COMPLAINT:   Chief Complaint  Patient presents with  . Shortness of Breath    HISTORY OF PRESENT ILLNESS:  Katrina Stout  is a 36 y.o. female with a known history of  Hypertension, ongoing smoking, not on home oxygen presents to hospital secondary to worsening cough, congestion, shortness of breath and chest tightness. Her symptoms started about 3-4 days ago with cough and congestion. Getting worse in the last couple of days that she was unable to breathe. Last night she was in the emergency room for her daughter. This morning her breathing got worse and she felt like something was sitting on her chest. She started to have myalgias and blacked out in the kitchen and so presented to the emergency room. Her blood pressure was elevated at 211/93. He is on clonidine for hypertension and says that she did not take it today. Patient was in the emergency room about a month ago with difficulty breathing and also pedal edema and was discharged on Lasix. She hasn't followed up and has not been taking her Lasix. Chest x-ray shows pulmonary edema and bronchitis findings. So she is being admitted for the same. Also noted to be hypoxic requiring about 4 L oxygen currently. Also noted to be tachypneic  PAST MEDICAL HISTORY:   Past Medical History:  Diagnosis Date  . Hypertension     PAST SURGICAL HISTORY:   Past Surgical History:  Procedure Laterality Date  . TYMPANOSTOMY TUBE PLACEMENT      SOCIAL HISTORY:   Social History  Substance Use Topics  . Smoking status: Current Every Day Smoker    Packs/day: 1.00  . Smokeless tobacco: Never Used  . Alcohol use Yes     Comment: occasional    FAMILY HISTORY:   Family  History  Problem Relation Age of Onset  . CAD Mother   . Diabetes Father     DRUG ALLERGIES:  No Known Allergies  REVIEW OF SYSTEMS:   Review of Systems  Constitutional: Positive for malaise/fatigue. Negative for chills, fever and weight loss.  HENT: Positive for congestion. Negative for ear discharge, ear pain, hearing loss and nosebleeds.   Eyes: Negative for blurred vision, double vision and photophobia.  Respiratory: Positive for cough, shortness of breath and wheezing. Negative for hemoptysis.   Cardiovascular: Positive for orthopnea. Negative for chest pain, palpitations and leg swelling.  Gastrointestinal: Positive for nausea. Negative for abdominal pain, constipation, diarrhea, heartburn, melena and vomiting.  Genitourinary: Negative for dysuria, hematuria and urgency.  Musculoskeletal: Negative for myalgias and neck pain.  Skin: Negative for rash.  Neurological: Negative for dizziness, sensory change, speech change, focal weakness and headaches.  Endo/Heme/Allergies: Does not bruise/bleed easily.  Psychiatric/Behavioral: Negative for depression.    MEDICATIONS AT HOME:   Prior to Admission medications   Medication Sig Start Date End Date Taking? Authorizing Provider  albuterol (PROVENTIL HFA;VENTOLIN HFA) 108 (90 Base) MCG/ACT inhaler Inhale 2-4 puffs by mouth every 4 hours as needed for wheezing, cough, and/or shortness of breath 02/13/16  Yes Loleta Roseory Forbach, MD  cloNIDine (CATAPRES) 0.2 MG tablet Take 1 tablet (0.2 mg total) by mouth 2 (two) times daily as needed (Hypertension). 09/19/16 09/19/17 Yes Jennye MoccasinBrian S Quigley, MD  furosemide (  LASIX) 20 MG tablet Take 1 tablet (20 mg total) by mouth daily. 09/19/16  Yes Jennye Moccasin, MD  ibuprofen (ADVIL,MOTRIN) 200 MG tablet Take 200 mg by mouth every 6 (six) hours as needed for fever, headache or moderate pain.   Yes Historical Provider, MD      VITAL SIGNS:  Blood pressure (!) 175/93, pulse (!) 112, temperature 98.8 F (37.1  C), temperature source Oral, resp. rate (!) 25, height 5\' 6"  (1.676 m), weight 113.4 kg (250 lb), last menstrual period 10/22/2016, SpO2 94 %, unknown if currently breastfeeding.  PHYSICAL EXAMINATION:   Physical Exam  GENERAL:  36 y.o.-year-old obese patient lying in the bed with no acute distress. Appears tachypneic EYES: Pupils equal, round, reactive to light and accommodation. No scleral icterus. Extraocular muscles intact.  HEENT: Head atraumatic, normocephalic. Oropharynx and nasopharynx clear.  NECK:  Supple, no jugular venous distention. No thyroid enlargement, no tenderness.  LUNGS: Normal breath sounds bilaterally, diffuse expiratory wheezing posteriorly at the bases associated with crackles as well. No rhonchi.. Using accessory muscles to breathe with minimal exertion. CARDIOVASCULAR: S1, S2 normal. No murmurs, rubs, or gallops.  ABDOMEN: Soft, obese, nontender, nondistended. Bowel sounds present. No organomegaly or mass.  EXTREMITIES: No pedal edema, cyanosis, or clubbing.  NEUROLOGIC: Cranial nerves II through XII are intact. Muscle strength 5/5 in all extremities. Sensation intact. Gait not checked.  PSYCHIATRIC: The patient is alert and oriented x 3.  SKIN: Eczema and open wounds on the abdomen from significant scratching.  LABORATORY PANEL:   CBC  Recent Labs Lab 10/22/16 1634  WBC 12.8*  HGB 11.4*  HCT 36.0  PLT 292   ------------------------------------------------------------------------------------------------------------------  Chemistries   Recent Labs Lab 10/22/16 1634  NA 140  K 3.8  CL 106  CO2 25  GLUCOSE 107*  BUN 11  CREATININE 0.63  CALCIUM 8.5*   ------------------------------------------------------------------------------------------------------------------  Cardiac Enzymes  Recent Labs Lab 10/22/16 1634  TROPONINI <0.03    ------------------------------------------------------------------------------------------------------------------  RADIOLOGY:  Dg Chest 2 View  Result Date: 10/22/2016 CLINICAL DATA:  Flu-like symptoms beginning this morning. Nonproductive cough and shortness of breath throughout the day. Weakness, headache and body aches. Syncope. EXAM: CHEST  2 VIEW COMPARISON:  Chest x-ray dated 08/13/2016. FINDINGS: Mild cardiomegaly. Central pulmonary vascular congestion mild bilateral interstitial edema. No confluent opacity to suggest consolidating pneumonia. No pleural effusion or pneumothorax seen. Osseous and soft tissue structures about the chest are unremarkable. IMPRESSION: 1. Cardiomegaly with central pulmonary vascular congestion and mild bilateral interstitial edema suggesting mild CHF/volume overload, alternatively infectious or inflammatory bronchitis. 2. No evidence of consolidating pneumonia. Electronically Signed   By: Bary Richard M.D.   On: 10/22/2016 17:48    EKG:   Orders placed or performed during the hospital encounter of 10/22/16  . ED EKG  . ED EKG    IMPRESSION AND PLAN:   Samanthamarie Ezzell  is a 36 y.o. female with a known history of  Hypertension, ongoing smoking, not on home oxygen presents to hospital secondary to worsening cough, congestion, shortness of breath and chest tightness.  #1 acute hypoxic respiratory failure-hypoxia and tachypnea noted. -Currently requiring 4 L oxygen. Secondary to new onset CHF exacerbation and also COPD with acute bronchitis. -Steroids, neb treatments ordered. Also started on Levaquin. -Flu test is negative. - wean o2 as tolerated, Assess for home o2 at dsicharge  #2 New onset CHF-chest x-ray with pulmonary edema and also noted to have cardiomegaly. -EKG with LVH Noted. Long history of hypertension. -Admit to  telemetry, recycle troponins. -Started on Lasix IV twice a day. Also likely has underlying sleep apnea and will need sleep study as  outpatient. -Echocardiogram and cardiology consult  #3 hypertensive urgency-restart clonidine, also on lasix. Coreg added. Depending on EF, ACEI/ARB can be started at discharge if needed.  #4 DVT prophylaxis- lovenox   All the records are reviewed and case discussed with ED provider. Management plans discussed with the patient, family and they are in agreement.  CODE STATUS: Full code  TOTAL TIME TAKING CARE OF THIS PATIENT: 50 minutes.    Ayra Hodgdon M.D on 10/22/2016 at 8:53 PM  Between 7am to 6pm - Pager - [phone]  After 6pm go to [email or web address] - Social research officer, government  [facility]  Office  [phone]  CC: Primary care physician; No PCP Per Patient

## 2016-10-22 NOTE — ED Notes (Signed)
Patient sleeping when RN entered room. Pt's O2 saturation at 87% on 3L [place]. Pt's O2 increased to 4L [place] - patient O2 saturation increased to 88-89% on 4L. Pt's O2 increaed to 6L on [place]. Pt's O2 saturation currently 90% on 6L . RN will continue to monitor

## 2016-10-22 NOTE — ED Triage Notes (Signed)
Pt to ED reporting flu like symptoms beginning this morning. Pt reports having a non productive cough and SOB throughout the day that pt reports feels as though "something is sitting on my chest" . Pt reports feeling feverish at home but denies taking temperature. Pt reports feeling weak with a headache and body aches and states she "blacked out while walking to the kitchen today"    Pt has hx of HTN and reports taking medications normally but reports not having taken them today. Pt is HTN in triage at 211/93.

## 2016-10-22 NOTE — ED Notes (Signed)
PT O2 saturation 92-93 on 2L [place]. Pt Oe increased to 3L [place]. Patient O2 saturation currently 94-95% on 3L. MD notified. RN will continue to monitor

## 2016-10-22 NOTE — ED Notes (Signed)
Pt placed on 2 Liters oxygen

## 2016-10-22 NOTE — ED Provider Notes (Signed)
[facility] Emergency Department Provider Note  ____________________________________________  Time seen: Approximately 5:58 PM  I have reviewed the triage vital signs and the nursing notes.   HISTORY  Chief Complaint Shortness of Breath   HPI Katrina Stout is a 36 y.o. female the history of hypertension who presents for evaluation of flulike symptoms. Patient reports that she brought her daughter to the hospital yesterday and as soon as she got home she started having body aches, fever, chills, cough productive of clear sputum, headache and shortness of breath. Patient is a smoker. No history of asthma or COPD. She has been wheezing. She has had generalized weakness as well. Has had a few episodes of watery diarrhea. No nausea or vomiting. Patient denies chest pain. She reports feeling dizzy earlier today. She hasn't taken her medications today. No flu shot.  Past Medical History:  Diagnosis Date  . Hypertension     Patient Active Problem List   Diagnosis Date Noted  . CHF (congestive heart failure) (HCC) 10/22/2016    Past Surgical History:  Procedure Laterality Date  . TYMPANOSTOMY TUBE PLACEMENT      Prior to Admission medications   Medication Sig Start Date End Date Taking? Authorizing Provider  albuterol (PROVENTIL HFA;VENTOLIN HFA) 108 (90 Base) MCG/ACT inhaler Inhale 2-4 puffs by mouth every 4 hours as needed for wheezing, cough, and/or shortness of breath 02/13/16  Yes Loleta Rose, MD  cloNIDine (CATAPRES) 0.2 MG tablet Take 1 tablet (0.2 mg total) by mouth 2 (two) times daily as needed (Hypertension). 09/19/16 09/19/17 Yes Jennye Moccasin, MD  furosemide (LASIX) 20 MG tablet Take 1 tablet (20 mg total) by mouth daily. 09/19/16  Yes Jennye Moccasin, MD  ibuprofen (ADVIL,MOTRIN) 200 MG tablet Take 200 mg by mouth every 6 (six) hours as needed for fever, headache or moderate pain.   Yes Historical Provider, MD    Allergies Patient has no  known allergies.  Family History  Problem Relation Age of Onset  . CAD Mother   . Diabetes Father     Social History Social History  Substance Use Topics  . Smoking status: Current Every Day Smoker    Packs/day: 1.00  . Smokeless tobacco: Never Used  . Alcohol use Yes     Comment: occasional    Review of Systems  Constitutional: + fever, chills, body aches, dizziness Eyes: Negative for visual changes. ENT: Negative for sore throat. Neck: No neck pain  Cardiovascular: Negative for chest pain. Respiratory: + shortness of breath, productive cough and wheezing Gastrointestinal: Negative for abdominal pain, vomiting or diarrhea. Genitourinary: Negative for dysuria. Musculoskeletal: Negative for back pain. Skin: Negative for rash. Neurological: Negative for  weakness or numbness. + HA Psych: No SI or HI  ____________________________________________   PHYSICAL EXAM:  VITAL SIGNS: ED Triage Vitals  Enc Vitals Group     BP 10/22/16 1622 (!) 211/93     Pulse Rate 10/22/16 1622 (!) 102     Resp 10/22/16 1622 16     Temp 10/22/16 1622 98.8 F (37.1 C)     Temp Source 10/22/16 1622 Oral     SpO2 10/22/16 1622 92 %     Weight 10/22/16 1623 250 lb (113.4 kg)     Height 10/22/16 1623 5\' 6"  (1.676 m)     Head Circumference --      Peak Flow --      Pain Score --      Pain Loc --  Pain Edu? --      Excl. in GC? --     Constitutional: Alert and oriented. Well appearing and in no apparent distress. HEENT:      Head: Normocephalic and atraumatic.         Eyes: Conjunctivae are normal. Sclera is non-icteric. EOMI. PERRL      Mouth/Throat: Mucous membranes are moist.       Neck: Supple with no signs of meningismus. Cardiovascular: Tachycardic with regular rhythm. No murmurs, gallops, or rubs. 2+ symmetrical distal pulses are present in all extremities. No JVD. Respiratory: Normal work of breathing, satting 92% on room air, course rhonchi bilaterally with expiratory  wheezes Gastrointestinal: Soft, non tender, and non distended with positive bowel sounds. No rebound or guarding. Genitourinary: No CVA tenderness. Musculoskeletal: 2+ pitting edema on b/l LE Neurologic: Normal speech and language. Face is symmetric. Moving all extremities. No gross focal neurologic deficits are appreciated. Skin: Skin is warm, dry and intact. No rash noted. Psychiatric: Mood and affect are normal. Speech and behavior are normal.  ____________________________________________   LABS (all labs ordered are listed, but only abnormal results are displayed)  Labs Reviewed  BASIC METABOLIC PANEL - Abnormal; Notable for the following:       Result Value   Glucose, Bld 107 (*)    Calcium 8.5 (*)    All other components within normal limits  CBC - Abnormal; Notable for the following:    WBC 12.8 (*)    Hemoglobin 11.4 (*)    MCHC 31.8 (*)    RDW 16.0 (*)    All other components within normal limits  BRAIN NATRIURETIC PEPTIDE - Abnormal; Notable for the following:    B Natriuretic Peptide 198.0 (*)    All other components within normal limits  INFLUENZA PANEL BY PCR (TYPE A & B)  TROPONIN I   ____________________________________________  EKG  ED ECG REPORT I, Nita Sicklearolina Florie Carico, the attending physician, personally viewed and interpreted this ECG.  Sinus tachycardia, rate of 100, normal intervals, left axis deviation, LVH, no ST elevations or depressions. Unchanged from prior ____________________________________________  RADIOLOGY  CXR: 1. Cardiomegaly with central pulmonary vascular congestion and mild bilateral interstitial edema suggesting mild CHF/volume overload, alternatively infectious or inflammatory bronchitis. 2. No evidence of consolidating pneumonia. ____________________________________________   PROCEDURES  Procedure(s) performed: None Procedures Critical Care performed: yes  CRITICAL CARE Performed by: Nita Sicklearolina Tomeika Weinmann  ?  Total critical  care time: 35 min  Critical care time was exclusive of separately billable procedures and treating other patients.  Critical care was necessary to treat or prevent imminent or life-threatening deterioration.  Critical care was time spent personally by me on the following activities: development of treatment plan with patient and/or surrogate as well as nursing, discussions with consultants, evaluation of patient's response to treatment, examination of patient, obtaining history from patient or surrogate, ordering and performing treatments and interventions, ordering and review of laboratory studies, ordering and review of radiographic studies, pulse oximetry and re-evaluation of patient's condition.  ____________________________________________   INITIAL IMPRESSION / ASSESSMENT AND PLAN / ED COURSE   36 y.o. female the history of hypertension who presents for evaluation of flulike symptoms since yesterday. Patient is satting 92% on room air with coarse rhonchi and expiratory wheezes. She has a history of smoking. We'll give her DuoNeb treatments. Chest x-ray concerning for pulmonary edema versus infectious bronchitis. Clinical picture is more suggestive of infection although patient does have bilateral pitting edema. BNP has been ordered and so  has troponin. We'll give her Toradol for body aches. We'll hold off on fluids at this time but to results of BNP. We'll check for flu. Watch patient on telemetry.   ED course:  Patient received 3 duo nebs, prednisone, IV Lasix, IV ceftriaxone, and azithromycin for community-acquired pneumonia and pulmonary edema. She continues to have wheezing and a new oxygen requirement of 3 L [place]. Patient be admitted to the hospitalist service.  Pertinent labs & imaging results that were available during my care of the patient were reviewed by me and considered in my medical decision making (see chart for  details).    ____________________________________________   FINAL CLINICAL IMPRESSION(S) / ED DIAGNOSES  Final diagnoses:  Acute respiratory failure with hypoxia (HCC)  Community acquired pneumonia, unspecified laterality  Acute pulmonary edema (HCC)      NEW MEDICATIONS STARTED DURING THIS VISIT:  New Prescriptions   No medications on file     Note:  This document was prepared using Dragon voice recognition software and may include unintentional dictation errors.    Nita Sickle, MD 10/22/16 [phone]

## 2016-10-22 NOTE — ED Notes (Signed)
MD Don PerkingVeronese informed of patient's O2 saturation. MD Don PerkingVeronese ordered O2 on 4L [place]. Patient placed on 4L [place]. RN will continue to monitor

## 2016-10-23 ENCOUNTER — Inpatient Hospital Stay: Payer: Self-pay

## 2016-10-23 ENCOUNTER — Inpatient Hospital Stay (HOSPITAL_COMMUNITY)
Admit: 2016-10-23 | Discharge: 2016-10-23 | Disposition: A | Discharge status: Home / Self Care | Payer: Self-pay | Attending: Internal Medicine | Admitting: Internal Medicine

## 2016-10-23 ENCOUNTER — Encounter: Payer: Self-pay | Admitting: Physician Assistant

## 2016-10-23 DIAGNOSIS — J40 Bronchitis, not specified as acute or chronic: Secondary | ICD-10-CM | POA: Diagnosis present

## 2016-10-23 DIAGNOSIS — J9601 Acute respiratory failure with hypoxia: Secondary | ICD-10-CM

## 2016-10-23 DIAGNOSIS — I110 Hypertensive heart disease with heart failure: Principal | ICD-10-CM

## 2016-10-23 DIAGNOSIS — R0603 Acute respiratory distress: Secondary | ICD-10-CM | POA: Diagnosis present

## 2016-10-23 DIAGNOSIS — I5031 Acute diastolic (congestive) heart failure: Secondary | ICD-10-CM | POA: Diagnosis present

## 2016-10-23 DIAGNOSIS — R000 Tachycardia, unspecified: Secondary | ICD-10-CM | POA: Diagnosis present

## 2016-10-23 DIAGNOSIS — R0902 Hypoxemia: Secondary | ICD-10-CM | POA: Diagnosis present

## 2016-10-23 LAB — CBC
HCT: 36.1 % (ref 35.0–47.0)
Hemoglobin: 11.5 g/dL — ABNORMAL LOW (ref 12.0–16.0)
MCH: 25.8 pg — AB (ref 26.0–34.0)
MCHC: 31.8 g/dL — AB (ref 32.0–36.0)
MCV: 81.2 fL (ref 80.0–100.0)
Platelets: 310 10*3/uL (ref 150–440)
RBC: 4.44 MIL/uL (ref 3.80–5.20)
RDW: 16.5 % — AB (ref 11.5–14.5)
WBC: 12.4 10*3/uL — ABNORMAL HIGH (ref 3.6–11.0)

## 2016-10-23 LAB — BASIC METABOLIC PANEL
Anion gap: 10 (ref 5–15)
BUN: 14 mg/dL (ref 6–20)
CALCIUM: 8.6 mg/dL — AB (ref 8.9–10.3)
CO2: 27 mmol/L (ref 22–32)
CREATININE: 0.69 mg/dL (ref 0.44–1.00)
Chloride: 104 mmol/L (ref 101–111)
GFR calc Af Amer: >60 mL/min (ref >60)
GLUCOSE: 149 mg/dL — AB (ref 65–99)
Potassium: 3.5 mmol/L (ref 3.5–5.1)
Sodium: 141 mmol/L (ref 135–145)

## 2016-10-23 LAB — TSH: TSH: 2.054 u[IU]/mL (ref 0.350–4.500)

## 2016-10-23 LAB — ECHOCARDIOGRAM COMPLETE
HEIGHTINCHES: 66 in
WEIGHTICAEL: 4000 [oz_av]

## 2016-10-23 LAB — FIBRIN DERIVATIVES D-DIMER (ARMC ONLY): FIBRIN DERIVATIVES D-DIMER (ARMC): 635 — AB (ref 0–499)

## 2016-10-23 LAB — MAGNESIUM: MAGNESIUM: 1.9 mg/dL (ref 1.7–2.4)

## 2016-10-23 LAB — TROPONIN I
TROPONIN I: 0.03 ng/mL — AB (ref <0.03)
TROPONIN I: 0.04 ng/mL — AB (ref <0.03)

## 2016-10-23 MED ORDER — LISINOPRIL 20 MG PO TABS
20.0000 mg | ORAL_TABLET | Freq: Every day | ORAL | Status: DC
  Filled 2016-10-23: qty 1

## 2016-10-23 MED ORDER — HYDRALAZINE HCL 50 MG PO TABS
50.0000 mg | ORAL_TABLET | Freq: Three times a day (TID) | ORAL | Status: DC
  Administered 2016-10-23 – 2016-10-24 (×3): 50 mg via ORAL
  Filled 2016-10-23 (×3): qty 1

## 2016-10-23 MED ORDER — POTASSIUM CHLORIDE CRYS ER 20 MEQ PO TBCR
20.0000 meq | EXTENDED_RELEASE_TABLET | Freq: Two times a day (BID) | ORAL | Status: DC
  Administered 2016-10-23 – 2016-10-24 (×2): 20 meq via ORAL
  Filled 2016-10-23 (×2): qty 1

## 2016-10-23 MED ORDER — LISINOPRIL 10 MG PO TABS
10.0000 mg | ORAL_TABLET | Freq: Every day | ORAL | Status: DC
  Administered 2016-10-23: 10 mg via ORAL
  Filled 2016-10-23: qty 1

## 2016-10-23 MED ORDER — POTASSIUM CHLORIDE CRYS ER 20 MEQ PO TBCR
40.0000 meq | EXTENDED_RELEASE_TABLET | Freq: Once | ORAL | Status: AC
  Administered 2016-10-23: 40 meq via ORAL
  Filled 2016-10-23: qty 2

## 2016-10-23 MED ORDER — AMLODIPINE BESYLATE 10 MG PO TABS
10.0000 mg | ORAL_TABLET | Freq: Every day | ORAL | Status: DC
  Administered 2016-10-24: 10 mg via ORAL
  Filled 2016-10-23: qty 1

## 2016-10-23 MED ORDER — AMLODIPINE BESYLATE 5 MG PO TABS
5.0000 mg | ORAL_TABLET | Freq: Every day | ORAL | Status: DC
  Administered 2016-10-23: 5 mg via ORAL
  Filled 2016-10-23: qty 1

## 2016-10-23 MED ORDER — IOPAMIDOL (ISOVUE-370) INJECTION 76%
75.0000 mL | Freq: Once | INTRAVENOUS | Status: AC | PRN
  Administered 2016-10-23: 75 mL via INTRAVENOUS

## 2016-10-23 MED ORDER — SALINE SPRAY 0.65 % NA SOLN
1.0000 | NASAL | Status: DC | PRN
  Filled 2016-10-23: qty 44

## 2016-10-23 NOTE — Consult Note (Signed)
Cardiology Consultation Note  Patient ID: Katrina Stout, MRN: [id number], DOB/AGE: 25-Jan-1981 36 y.o. Admit date: 10/22/2016   Date of Consult: 10/23/2016 Primary Physician: No PCP Per Patient Primary Cardiologist: New to [facility] - consult by Kirke Corin Requesting Physician: Dr. Nemiah Commander, MD  Chief Complaint: SOB Reason for Consult: Acute respiratory distress with hypoxia  HPI: 36 y.o. female with h/o accelerated poorly controlled hypertension on clonidine since 07/2106, morbid obesity, ongoing tobacco abuse x 20 years at 0.5 to 1 pack daily, medication noncompliance, and eczema who presented to [facility] on 2/1 with increased SOB, wheezing, myalgias, and cough.   She does not have any previously known cardiac history and has never seen a cardiologist before. She was seen in the ED in 01/2016 for PNA with BP 180's systolic. She was treated with Levaquin and Albuterol. She was seen again in the ED in 07/2016 for recurrent PNA. BP at that time was 195/97. She was treated with ABX and discharged. She was seen for a third time in the EF in 08/2016 with lower extremity swelling. BP at that time was noted to be 215/99. She was started on clonidine and Lasix. She reports compliance with her Lasix though has missed the past 2 days with her clonidine. She does not check her BP or weight at home. She has slept in a recliner for the past 12 months 2/2 SOB. She denies any early satiety or PND.   On 2/1 she developed sudden onset of SOB, wheezing, yellow mucus with blood mixed in, myalgias, and subjective fever and chills. No chest pain.   Upon the patient's arrival to [facility] they were found to have BP of 211/93. Influenza was negative for A and B. Troponin negative x 3. BNP 198. SCr 0.63. K+ 3.8-->3.5. WBC 12.8, hgb 11.4, plt 292. She was hypoxic on room air with oxygen saturations in the 80's on room air, requiring supplemental oxygen via nasal cannula at 3 L. CXR showed vascular congestion with possible bronchitis. ECG as  below. She has remained in sinus tachycardia with heart rates in the 1-teens as well as hypoxic requiring supplemental oxygen as above. She was started on IV Lasix without KCl repletion with a UOP of 1.1 L to date. She was also started on Coreg and has noted a worsening of her wheezing. Weight upon admission was 250 pounds. Weight at last ED visit on 12/30 was 240 pounds.   Past Medical History:  Diagnosis Date  . Anemia   . Eczema   . H/O medication noncompliance   . Hypertension   . Morbid obesity (HCC)       Most Recent Cardiac Studies: none   Surgical History:  Past Surgical History:  Procedure Laterality Date  . TYMPANOSTOMY TUBE PLACEMENT       Home Meds: Prior to Admission medications   Medication Sig Start Date End Date Taking? Authorizing Provider  albuterol (PROVENTIL HFA;VENTOLIN HFA) 108 (90 Base) MCG/ACT inhaler Inhale 2-4 puffs by mouth every 4 hours as needed for wheezing, cough, and/or shortness of breath 02/13/16  Yes Loleta Rose, MD  cloNIDine (CATAPRES) 0.2 MG tablet Take 1 tablet (0.2 mg total) by mouth 2 (two) times daily as needed (Hypertension). 09/19/16 09/19/17 Yes Jennye Moccasin, MD  furosemide (LASIX) 20 MG tablet Take 1 tablet (20 mg total) by mouth daily. 09/19/16  Yes Jennye Moccasin, MD  ibuprofen (ADVIL,MOTRIN) 200 MG tablet Take 200 mg by mouth every 6 (six) hours as needed for fever, headache or moderate  pain.   Yes Historical Provider, MD    Inpatient Medications:  . aspirin  81 mg Oral Daily  . carvedilol  6.25 mg Oral BID WC  . docusate sodium  100 mg Oral BID  . enoxaparin (LOVENOX) injection  40 mg Subcutaneous Q12H  . furosemide  40 mg Intravenous Q12H  . Influenza vac split quadrivalent PF  0.5 mL Intramuscular Tomorrow-1000  . ipratropium-albuterol  3 mL Nebulization Q4H  . levofloxacin  500 mg Oral Daily  . lisinopril  10 mg Oral Daily  . mouth rinse  15 mL Mouth Rinse BID  . methylPREDNISolone (SOLU-MEDROL) injection  40 mg  Intravenous Daily  . neomycin-bacitracin-polymyxin   Topical BID  . nicotine  21 mg Transdermal Daily  . potassium chloride  20 mEq Oral BID  . potassium chloride  40 mEq Oral Once  . sodium chloride flush  3 mL Intravenous Q12H     Allergies: No Known Allergies  Social History   Social History  . Marital status: Single    Spouse name: N/A  . Number of children: N/A  . Years of education: N/A   Occupational History  . Not on file.   Social History Main Topics  . Smoking status: Current Every Day Smoker    Packs/day: 1.00  . Smokeless tobacco: Never Used  . Alcohol use Yes     Comment: occasional  . Drug use: No  . Sexual activity: Not on file   Other Topics Concern  . Not on file   Social History Narrative   Independent at [employer].     Family History  Problem Relation Age of Onset  . CAD Mother   . Diabetes Father      Review of Systems: Review of Systems  Constitutional: Positive for malaise/fatigue. Negative for chills, diaphoresis, fever and weight loss.  HENT: Negative for congestion.   Eyes: Negative for discharge and redness.  Respiratory: Positive for cough, hemoptysis, shortness of breath and wheezing. Negative for sputum production.   Cardiovascular: Positive for orthopnea and leg swelling. Negative for chest pain, palpitations, claudication and PND.       Sleeps in a recliner x 1 year  Gastrointestinal: Negative for abdominal pain, blood in stool, heartburn, melena, nausea and vomiting.  Genitourinary: Negative for hematuria.  Musculoskeletal: Negative for falls and myalgias.  Skin: Negative for rash.  Neurological: Positive for weakness. Negative for dizziness, tingling, tremors, sensory change, speech change, focal weakness and loss of consciousness.  Endo/Heme/Allergies: Does not bruise/bleed easily.  Psychiatric/Behavioral: Negative for substance abuse. The patient is not nervous/anxious.   All other systems reviewed and are  negative.   Labs:  Recent Labs  10/22/16 1634 10/22/16 2136 10/23/16 0352  TROPONINI <0.03 <0.03 0.03*   Lab Results  Component Value Date   WBC 12.4 (H) 10/23/2016   HGB 11.5 (L) 10/23/2016   HCT 36.1 10/23/2016   MCV 81.2 10/23/2016   PLT 310 10/23/2016     Recent Labs Lab 10/23/16 0352  NA 141  K 3.5  CL 104  CO2 27  BUN 14  CREATININE 0.69  CALCIUM 8.6*  GLUCOSE 149*   No results found for: CHOL, HDL, LDLCALC, TRIG No results found for: DDIMER  Radiology/Studies:  Dg Chest 2 View  Result Date: 10/22/2016 CLINICAL DATA:  Flu-like symptoms beginning this morning. Nonproductive cough and shortness of breath throughout the day. Weakness, headache and body aches. Syncope. EXAM: CHEST  2 VIEW COMPARISON:  Chest x-ray dated 08/13/2016. FINDINGS: Mild  cardiomegaly. Central pulmonary vascular congestion mild bilateral interstitial edema. No confluent opacity to suggest consolidating pneumonia. No pleural effusion or pneumothorax seen. Osseous and soft tissue structures about the chest are unremarkable. IMPRESSION: 1. Cardiomegaly with central pulmonary vascular congestion and mild bilateral interstitial edema suggesting mild CHF/volume overload, alternatively infectious or inflammatory bronchitis. 2. No evidence of consolidating pneumonia. Electronically Signed   By: Bary Richard M.D.   On: 10/22/2016 17:48    EKG: Interpreted by me showed: NSR, 100 bpm, left atrial enlargement, LVH, early repolarization, nonspecific st/t changes Telemetry: Interpreted by me showed: sinus tachycardia, low 100's to 1-teens bpm  Weights: Filed Weights   10/22/16 1623  Weight: 250 lb (113.4 kg)     Physical Exam: Blood pressure (!) 191/82, pulse (!) 111, temperature 97.9 F (36.6 C), temperature source Oral, resp. rate 19, height 5\' 6"  (1.676 m), weight 250 lb (113.4 kg), last menstrual period 10/22/2016, SpO2 90 %, unknown if currently breastfeeding. Body mass index is 40.35  kg/m. General: Well developed, well nourished, in no acute distress. Head: Normocephalic, atraumatic, sclera non-icteric, no xanthomas, nares are without discharge.  Neck: Negative for carotid bruits. JVD not elevated. Lungs: Diffuse wheezing with rhonchi and crackles. On oxygen supplementation at 3 L via nasal cannula.  Heart: Tachycardic with S1 S2. No murmurs, rubs, or gallops appreciated. Abdomen: Obese, soft, non-tender, non-distended with normoactive bowel sounds. No hepatomegaly. No rebound/guarding. No obvious abdominal masses. Msk:  Strength and tone appear normal for age. Extremities: No clubbing or cyanosis. 1+ pitting edema to the bilateral knees. No erythema, warmth, TTP, or cording noted along the bilateral LE. Distal pedal pulses are 2+ and equal bilaterally. Neuro: Alert and oriented X 3. No facial asymmetry. No focal deficit. Moves all extremities spontaneously. Psych:  Responds to questions appropriately with a normal affect.    Assessment and Plan:  Principal Problem:   Acute respiratory distress Active Problems:   Hypoxia   Sinus tachycardia   Bronchitis   Acute diastolic CHF (congestive heart failure) (HCC)   Morbid obesity (HCC)    1. Acute respiratory distress with hypoxia: -Oxygen saturations in the 80's on room air requiring supplemental oxygen via nasal cannula t 3 L -Cannot rule out pulmonary embolism given her hypoxia, sinus tachycardia, and sedentary lifestyle -Check stat D-dimer, if positive will need CTA chest PE protocol  -Wean oxygen as able -Likely multifactorial including AECOPD/bronchitis/possible acute diastolic CHF/possible PE  2. Acute diastolic CHF: -BNP minimally elevated, CXR with vascular congestion  -Continue IV Lasix 40 mg bid with KCl repletion -Echo pending to evaluate LVSF, wall motion, RV size, and PASP -Stop Coreg given her pulmonary decompensation with active wheezing -Could restart beta blocker at a later date -Her sinus  tachycardia is likely multifactorial including bronchitis/URI/possible CHF/possible PE -Needs scale for home use  3. Accelerated hypertension: -BP remains elevated in the 190's systolic -Stop clonidine and use first line antihypertensives to max tolerated dose -Increase lisinopril to 20 mg daily -Add amlodipine 5 mg daily, monitor for worsening of LE swelling, titrate if able -Consider thiazide diuretic  -Coreg stopped as above given wheezing -Titrate medications as needed -Needs BP cuff for home  4. AECOPD/bronchitis: -Nebs, ABX, steroids per IM  5. Anemia: -Stable -Needs work up -Defer to IM  6. Hypokalemia: -Replete to 4.0 -Check magnesium and TSH  7. Tobacco abuse: -Cessation advised      Signed, Eula Listen, PA-C [facility] Pager: [phone] 10/23/2016, 10:17 AM

## 2016-10-23 NOTE — Progress Notes (Signed)
    D-dimer elevated at 635. Renal function normal. Will order CTA chest PE protocol to evaluate for pulmonary embolus as well as lower extremity ultrasound to evaluate for DVT.

## 2016-10-23 NOTE — Progress Notes (Signed)
Initial Heart Failure Clinic appointment scheduled on November 02, 2016 at 10:00am. Thank you.

## 2016-10-23 NOTE — Progress Notes (Signed)
*  PRELIMINARY RESULTS* Echocardiogram 2D Echocardiogram has been performed.  Cristela BlueHege, Duwayne Matters 10/23/2016, 7:48 AM

## 2016-10-23 NOTE — Care Management (Addendum)
No pcp or insurance.  Provided patient with application for [facility].  Left communication for attending to speak with CM at time of discharge for medication assist.  Patient works for [employer].  Requests names of sliding scale clinics- CM provided contacts. Knowledgeable of generic medications

## 2016-10-23 NOTE — Progress Notes (Signed)
[facility] [place] [facility]   PATIENT NAME: Katrina Stout    MR#:  [id number]  DATE OF BIRTH:  1980-11-29  SUBJECTIVE:   Came in with increasing sob and wheezing. On 4 liter [place] H/o smoking REVIEW OF SYSTEMS:   Review of Systems  Constitutional: Negative for chills, fever and weight loss.  HENT: Negative for ear discharge, ear pain and nosebleeds.   Eyes: Negative for blurred vision, pain and discharge.  Respiratory: Positive for shortness of breath and wheezing. Negative for sputum production and stridor.   Cardiovascular: Positive for leg swelling. Negative for chest pain, palpitations, orthopnea and PND.  Gastrointestinal: Negative for abdominal pain, diarrhea, nausea and vomiting.  Genitourinary: Negative for frequency and urgency.  Musculoskeletal: Negative for back pain and joint pain.  Neurological: Positive for weakness. Negative for sensory change, speech change and focal weakness.  Psychiatric/Behavioral: Negative for depression and hallucinations. The patient is not nervous/anxious.    Tolerating Diet:yes Tolerating PT: not needed  DRUG ALLERGIES:  No Known Allergies  VITALS:  Blood pressure (!) 193/85, pulse (!) 103, temperature 97.4 F (36.3 C), temperature source Oral, resp. rate 19, height 5\' 6"  (1.676 m), weight 113.4 kg (250 lb), last menstrual period 10/22/2016, SpO2 92 %, unknown if currently breastfeeding.  PHYSICAL EXAMINATION:   Physical Exam  GENERAL:  36 y.o.-year-old patient lying in the bed with no acute distress. obese EYES: Pupils equal, round, reactive to light and accommodation. No scleral icterus. Extraocular muscles intact.  HEENT: Head atraumatic, normocephalic. Oropharynx and nasopharynx clear.  NECK:  Supple, no jugular venous distention. No thyroid enlargement, no tenderness.  LUNGS: distant breath sounds bilaterally, no wheezing, rales, rhonchi. No use of accessory muscles of respiration.  CARDIOVASCULAR:  S1, S2 normal. No murmurs, rubs, or gallops.  ABDOMEN: Soft, nontender, nondistended. Bowel sounds present. No organomegaly or mass.  EXTREMITIES: No cyanosis, clubbing  ++ edema b/l.    NEUROLOGIC: Cranial nerves II through XII are intact. No focal Motor or sensory deficits b/l.   PSYCHIATRIC:  patient is alert and oriented x 3.  SKIN: No obvious rash, lesion, or ulcer.   LABORATORY PANEL:  CBC  Recent Labs Lab 10/23/16 0352  WBC 12.4*  HGB 11.5*  HCT 36.1  PLT 310    Chemistries   Recent Labs Lab 10/23/16 0352 10/23/16 1009  NA 141  --   K 3.5  --   CL 104  --   CO2 27  --   GLUCOSE 149*  --   BUN 14  --   CREATININE 0.69  --   CALCIUM 8.6*  --   MG  --  1.9   Cardiac Enzymes  Recent Labs Lab 10/23/16 0938  TROPONINI 0.04*   RADIOLOGY:  Dg Chest 2 View  Result Date: 10/22/2016 CLINICAL DATA:  Flu-like symptoms beginning this morning. Nonproductive cough and shortness of breath throughout the day. Weakness, headache and body aches. Syncope. EXAM: CHEST  2 VIEW COMPARISON:  Chest x-ray dated 08/13/2016. FINDINGS: Mild cardiomegaly. Central pulmonary vascular congestion mild bilateral interstitial edema. No confluent opacity to suggest consolidating pneumonia. No pleural effusion or pneumothorax seen. Osseous and soft tissue structures about the chest are unremarkable. IMPRESSION: 1. Cardiomegaly with central pulmonary vascular congestion and mild bilateral interstitial edema suggesting mild CHF/volume overload, alternatively infectious or inflammatory bronchitis. 2. No evidence of consolidating pneumonia. Electronically Signed   By: Bary Richard M.D.   On: 10/22/2016 17:48   Ct Angio Chest Pe W Or  Wo Contrast  Result Date: 10/23/2016 CLINICAL DATA:  Increased shortness of breath and elevated D-dimer EXAM: CT ANGIOGRAPHY CHEST WITH CONTRAST TECHNIQUE: Multidetector CT imaging of the chest was performed using the standard protocol during bolus administration of  intravenous contrast. Multiplanar CT image reconstructions and MIPs were obtained to evaluate the vascular anatomy. CONTRAST:  75 mL Isovue 370. COMPARISON:  None. FINDINGS: Cardiovascular: Thoracic aorta shows no aneurysmal dilatation or dissection. Cardiac structures are mildly enlarged. No significant coronary calcifications are seen. Pulmonary artery shows a normal branching pattern without definitive filling defect to suggest pulmonary embolism. Mediastinum/Nodes: Small hilar lymph nodes are noted likely of reactive nature. The thoracic inlet is within normal limits. Lungs/Pleura: Lungs are well aerated bilaterally but demonstrate patchy infiltrates consistent with multifocal pneumonia. This is most marked in the anterior aspect of the right upper lobe. Upper Abdomen: Within normal limits. Musculoskeletal: Within normal limits Review of the MIP images confirms the above findings. IMPRESSION: Patchy bilateral infiltrates. No evidence of pulmonary emboli. Electronically Signed   By: Alcide Clever M.D.   On: 10/23/2016 12:25   US Venous Img Lower Bilateral  Result Date: 10/23/2016 CLINICAL DATA:  Right leg pain and edema for the past month. Shortness of breath for 1 day. Evaluate for DVT EXAM: BILATERAL LOWER EXTREMITY VENOUS DOPPLER ULTRASOUND TECHNIQUE: Gray-scale sonography with graded compression, as well as color Doppler and duplex ultrasound were performed to evaluate the lower extremity deep venous systems from the level of the common femoral vein and including the common femoral, femoral, profunda femoral, popliteal and calf veins including the posterior tibial, peroneal and gastrocnemius veins when visible. The superficial great saphenous vein was also interrogated. Spectral Doppler was utilized to evaluate flow at rest and with distal augmentation maneuvers in the common femoral, femoral and popliteal veins. COMPARISON:  None. FINDINGS: RIGHT LOWER EXTREMITY Common Femoral Vein: No evidence of  thrombus. Normal compressibility, respiratory phasicity and response to augmentation. Saphenofemoral Junction: No evidence of thrombus. Normal compressibility and flow on color Doppler imaging. Profunda Femoral Vein: No evidence of thrombus. Normal compressibility and flow on color Doppler imaging. Femoral Vein: No evidence of thrombus. Normal compressibility, respiratory phasicity and response to augmentation. Popliteal Vein: No evidence of thrombus. Normal compressibility, respiratory phasicity and response to augmentation. Calf Veins: No evidence of thrombus. Normal compressibility and flow on color Doppler imaging. Superficial Great Saphenous Vein: No evidence of thrombus. Normal compressibility and flow on color Doppler imaging. Venous Reflux:  None. Other Findings:  None. LEFT LOWER EXTREMITY Common Femoral Vein: No evidence of thrombus. Normal compressibility, respiratory phasicity and response to augmentation. Saphenofemoral Junction: No evidence of thrombus. Normal compressibility and flow on color Doppler imaging. Profunda Femoral Vein: No evidence of thrombus. Normal compressibility and flow on color Doppler imaging. Femoral Vein: No evidence of thrombus. Normal compressibility, respiratory phasicity and response to augmentation. Popliteal Vein: No evidence of thrombus. Normal compressibility, respiratory phasicity and response to augmentation. Calf Veins: No evidence of thrombus. Normal compressibility and flow on color Doppler imaging. Superficial Great Saphenous Vein: No evidence of thrombus. Normal compressibility and flow on color Doppler imaging. Venous Reflux:  None. Other Findings:  None. IMPRESSION: No evidence of DVT within either lower extremity. Electronically Signed   By: Simonne Come M.D.   On: 10/23/2016 13:05   ASSESSMENT AND PLAN:  Maitlyn Penza  is a 36 y.o. female with a known history of  Hypertension, ongoing smoking, not on home oxygen presents to hospital secondary to worsening  cough, congestion, shortness  of breath and chest tightness.  #1 acute hypoxic respiratory failure-hypoxia and tachypnea noted. -Currently requiring 4 L oxygen. Secondary to new onset CHF exacerbation and also COPD with acute bronchitis. -Steroids, neb treatments ordered. Also started on Levaquin. -Flu test is negative. - wean o2 as tolerated, Assess for home o2 at dsicharge  #2 New onset acute diastolic CHF-chest x-ray with pulmonary edema and also noted to have cardiomegaly. -EKG with LVH Noted. Long history of hypertension. -Admit to telemetry, recycle troponins. -Started on Lasix IV twice a day. Also likely has underlying sleep apnea and will need sleep study as outpatient. -Echocardiogram showed EF 55-60%   #3 hypertensive urgency-restart clonidine, also on lasix. Coreg added.  -lisinopril ad po hydralazine -lifestyle changes discussed  #4 DVT prophylaxis- lovenox  Case discussed with Care Management/Social Worker. Management plans discussed with the patient, family and they are in agreement.  CODE STATUS: FULL  DVT Prophylaxis:Loveonx TOTAL TIME TAKING CARE OF THIS PATIENT: 30 minutes.  >50% time spent on counselling and coordination of care  POSSIBLE D/C IN 1-2 DAYS, DEPENDING ON CLINICAL CONDITION.  Note: This dictation was prepared with Dragon dictation along with smaller phrase technology. Any transcriptional errors that result from this process are unintentional.  Sonia Stickels M.D on 10/23/2016 at 2:54 PM  Between 7am to 6pm - Pager - [phone]  After 6pm go to [email or web address] - Social research officer, governmentpassword [email or web address] [email or web address]  [facility]  Office  [phone]  CC: Primary care physician; No PCP Per Patient

## 2016-10-23 NOTE — Discharge Instructions (Signed)
Heart Failure Clinic appointment on November 02, 2016 at 10:00am with Katrina Kindredina Linh Hedberg, FNP. Please call [phone] to reschedule.

## 2016-10-24 LAB — HEMOGLOBIN A1C
Hgb A1c MFr Bld: 5.8 % — ABNORMAL HIGH (ref 4.8–5.6)
Mean Plasma Glucose: 120 mg/dL

## 2016-10-24 MED ORDER — PREDNISONE 10 MG PO TABS: ORAL_TABLET | ORAL | 0 refills | Status: DC

## 2016-10-24 MED ORDER — FUROSEMIDE 40 MG PO TABS: 40.0000 mg | ORAL_TABLET | Freq: Every day | ORAL | Status: DC

## 2016-10-24 MED ORDER — AMOXICILLIN-POT CLAVULANATE 875-125 MG PO TABS
1.0000 | ORAL_TABLET | Freq: Two times a day (BID) | ORAL | Status: DC
  Administered 2016-10-24: 1 via ORAL
  Filled 2016-10-24: qty 1

## 2016-10-24 MED ORDER — LISINOPRIL 20 MG PO TABS
20.0000 mg | ORAL_TABLET | Freq: Two times a day (BID) | ORAL | Status: DC
  Administered 2016-10-24: 20 mg via ORAL

## 2016-10-24 MED ORDER — AMLODIPINE BESYLATE 10 MG PO TABS: 10.0000 mg | ORAL_TABLET | Freq: Every day | ORAL | 2 refills | Status: AC

## 2016-10-24 MED ORDER — AMOXICILLIN-POT CLAVULANATE 875-125 MG PO TABS: 1.0000 | ORAL_TABLET | Freq: Two times a day (BID) | ORAL | 0 refills | Status: DC

## 2016-10-24 MED ORDER — PREDNISONE 50 MG PO TABS
50.0000 mg | ORAL_TABLET | Freq: Every day | ORAL | Status: DC
  Administered 2016-10-24: 50 mg via ORAL
  Filled 2016-10-24: qty 1

## 2016-10-24 MED ORDER — LISINOPRIL 20 MG PO TABS: 20.0000 mg | ORAL_TABLET | Freq: Two times a day (BID) | ORAL | 2 refills | Status: AC

## 2016-10-24 MED ORDER — ASPIRIN 81 MG PO CHEW: 81.0000 mg | CHEWABLE_TABLET | Freq: Every day | ORAL | 0 refills | Status: AC

## 2016-10-24 MED ORDER — HYDRALAZINE HCL 50 MG PO TABS: 50.0000 mg | ORAL_TABLET | Freq: Three times a day (TID) | ORAL | 3 refills | Status: AC

## 2016-10-24 NOTE — Care Management Note (Signed)
Case Management Note  Patient Details  Name: Katrina Stout MRN: [id number] Date of Birth: 1981-02-09  Subjective/Objective:   Ms Nonda LouSchlegel was provided with a GoodRx coupon, and GoodRx coupons for new discharge medications. Ms Nonda LouSchlegel already has applications to [facility].                 Action/Plan:   Expected Discharge Date:  10/24/16               Expected Discharge Plan:     In-House Referral:     Discharge planning Services     Post Acute Care Choice:    Choice offered to:     DME Arranged:    DME Agency:     HH Arranged:    HH Agency:     Status of Service:     If discussed at [employer] Meetings, dates discussed:    Additional Comments:  Ritha Sampedro A, RN 10/24/2016, 10:25 AM

## 2016-10-24 NOTE — Progress Notes (Signed)
Patient is discharge home in a stable condition, summary and f/u care given , with work note , verbalized understanding , left with family

## 2016-10-24 NOTE — Discharge Summary (Signed)
[facility] - [place] at [facility]   PATIENT NAME: Katrina Stout    MR#:  [id number]  DATE OF BIRTH:  1981-06-11  DATE OF ADMISSION:  10/22/2016 ADMITTING PHYSICIAN: Enid Baas, MD  DATE OF DISCHARGE: 10/24/2016  PRIMARY CARE PHYSICIAN: No PCP Per Patient    ADMISSION DIAGNOSIS:  Acute pulmonary edema (HCC) [J81.0] Acute respiratory failure with hypoxia (HCC) [J96.01] Community acquired pneumonia, unspecified laterality [J18.9]  DISCHARGE DIAGNOSIS:  Acute Diastolic CHF-new COPD exacerbation acute on chronic HTN-malignant -obesity  SECONDARY DIAGNOSIS:   Past Medical History:  Diagnosis Date  . Anemia   . Eczema   . H/O medication noncompliance   . Hypertension   . Morbid obesity [facility])     HOSPITAL COURSE:   Katrina Stout a 36 y.o. femalewith a known history of Hypertension, ongoing smoking, not on home oxygen presents to hospital secondary to worsening cough, congestion, shortness of breath and chest tightness.  #1 acute hypoxic respiratory failure-hypoxia and tachypnea noted. -Currently 99% on RA -pt has o new onset CHF exacerbation and also COPD with acute bronchitis. -Steroids, neb treatments prn. Change to po augmentin (no insurance) -Flu test is negative.  #2 Newonset acute diastolic CHF-chest x-ray with pulmonary edema and also noted to have cardiomegaly. -EKG with LVH Noted. Long history of hypertension. -diuresed 6.1 liters -was on Lasix IV twice a day---change to oral laisx -pt likely has underlying sleep apnea and will need sleep study as outpatient. -Echocardiogram showed EF 55-60%   #3 hypertensive urgency-restart clonidine, also on lasix. Coreg added.  -lisinopril ad po hydralazine -lifestyle changes discussed  #4 DVT prophylaxis- lovenox  D/c home CONSULTS OBTAINED:  Treatment Team:  Iran Ouch, MD  DRUG ALLERGIES:  No Known Allergies  DISCHARGE MEDICATIONS:   Current Discharge  Medication List    START taking these medications   Details  amLODipine (NORVASC) 10 MG tablet Take 1 tablet (10 mg total) by mouth daily. Qty: 30 tablet, Refills: 2    amoxicillin-clavulanate (AUGMENTIN) 875-125 MG tablet Take 1 tablet by mouth every 12 (twelve) hours. Qty: 8 tablet, Refills: 0    aspirin 81 MG chewable tablet Chew 1 tablet (81 mg total) by mouth daily. Qty: 30 tablet, Refills: 0    hydrALAZINE (APRESOLINE) 50 MG tablet Take 1 tablet (50 mg total) by mouth 3 (three) times daily. Qty: 90 tablet, Refills: 3    lisinopril (PRINIVIL,ZESTRIL) 20 MG tablet Take 1 tablet (20 mg total) by mouth 2 (two) times daily. Qty: 60 tablet, Refills: 2    predniSONE (DELTASONE) 10 MG tablet Take 50 mg daily taper by 10 mg daily thens stop Qty: 15 tablet, Refills: 0      CONTINUE these medications which have NOT CHANGED   Details  albuterol (PROVENTIL HFA;VENTOLIN HFA) 108 (90 Base) MCG/ACT inhaler Inhale 2-4 puffs by mouth every 4 hours as needed for wheezing, cough, and/or shortness of breath Qty: 1 Inhaler, Refills: 1    cloNIDine (CATAPRES) 0.2 MG tablet Take 1 tablet (0.2 mg total) by mouth 2 (two) times daily as needed (Hypertension). Qty: 60 tablet, Refills: 0    furosemide (LASIX) 20 MG tablet Take 1 tablet (20 mg total) by mouth daily. Qty: 30 tablet, Refills: 0      STOP taking these medications     ibuprofen (ADVIL,MOTRIN) 200 MG tablet         If you experience worsening of your admission symptoms, develop shortness of breath, life threatening emergency, suicidal or homicidal thoughts  you must seek medical attention immediately by calling 911 or calling your MD immediately  if symptoms less severe.  You Must read complete instructions/literature along with all the possible adverse reactions/side effects for all the Medicines you take and that have been prescribed to you. Take any new Medicines after you have completely understood and accept all the possible  adverse reactions/side effects.   Please note  You were cared for by a hospitalist during your hospital stay. If you have any questions about your discharge medications or the care you received while you were in the hospital after you are discharged, you can call the unit and asked to speak with the hospitalist on call if the hospitalist that took care of you is not available. Once you are discharged, your primary care physician will handle any further medical issues. Please note that NO REFILLS for any discharge medications will be authorized once you are discharged, as it is imperative that you return to your primary care physician (or establish a relationship with a primary care physician if you do not have one) for your aftercare needs so that they can reassess your need for medications and monitor your lab values. Today   SUBJECTIVE   Feels better  VITAL SIGNS:  Blood pressure (!) 175/86, pulse (!) 108, temperature 98 F (36.7 C), temperature source Oral, resp. rate 20, height 5\' 6"  (1.676 m), weight 113.4 kg (250 lb), last menstrual period 10/22/2016, SpO2 99 %, unknown if currently breastfeeding.  I/O:    Intake/Output Summary (Last 24 hours) at 10/24/16 0919 Last data filed at 10/24/16 0813  Gross per 24 hour  Intake                0 ml  Output             5000 ml  Net            -5000 ml    PHYSICAL EXAMINATION:  GENERAL:  36 y.o.-year-old patient lying in the bed with no acute distress. obese EYES: Pupils equal, round, reactive to light and accommodation. No scleral icterus. Extraocular muscles intact.  HEENT: Head atraumatic, normocephalic. Oropharynx and nasopharynx clear.  NECK:  Supple, no jugular venous distention. No thyroid enlargement, no tenderness.  LUNGS: Normal breath sounds bilaterally, no wheezing, rales,rhonchi or crepitation. No use of accessory muscles of respiration.  CARDIOVASCULAR: S1, S2 normal. No murmurs, rubs, or gallops.  ABDOMEN: Soft, non-tender,  non-distended. Bowel sounds present. No organomegaly or mass.  EXTREMITIES:+ pedal edema,no cyanosis, or clubbing.  NEUROLOGIC: Cranial nerves II through XII are intact. Muscle strength 5/5 in all extremities. Sensation intact. Gait not checked.  PSYCHIATRIC: The patient is alert and oriented x 3.  SKIN: No obvious rash, lesion, or ulcer.   DATA REVIEW:   CBC   Recent Labs Lab 10/23/16 0352  WBC 12.4*  HGB 11.5*  HCT 36.1  PLT 310    Chemistries   Recent Labs Lab 10/23/16 0352 10/23/16 1009  NA 141  --   K 3.5  --   CL 104  --   CO2 27  --   GLUCOSE 149*  --   BUN 14  --   CREATININE 0.69  --   CALCIUM 8.6*  --   MG  --  1.9    Microbiology Results   No results found for this or any previous visit (from the past 240 hour(s)).  RADIOLOGY:  Dg Chest 2 View  Result Date: 10/22/2016 CLINICAL DATA:  Flu-like symptoms beginning this morning. Nonproductive cough and shortness of breath throughout the day. Weakness, headache and body aches. Syncope. EXAM: CHEST  2 VIEW COMPARISON:  Chest x-ray dated 08/13/2016. FINDINGS: Mild cardiomegaly. Central pulmonary vascular congestion mild bilateral interstitial edema. No confluent opacity to suggest consolidating pneumonia. No pleural effusion or pneumothorax seen. Osseous and soft tissue structures about the chest are unremarkable. IMPRESSION: 1. Cardiomegaly with central pulmonary vascular congestion and mild bilateral interstitial edema suggesting mild CHF/volume overload, alternatively infectious or inflammatory bronchitis. 2. No evidence of consolidating pneumonia. Electronically Signed   By: Bary Richard M.D.   On: 10/22/2016 17:48   Ct Angio Chest Pe W Or Wo Contrast  Result Date: 10/23/2016 CLINICAL DATA:  Increased shortness of breath and elevated D-dimer EXAM: CT ANGIOGRAPHY CHEST WITH CONTRAST TECHNIQUE: Multidetector CT imaging of the chest was performed using the standard protocol during bolus administration of intravenous  contrast. Multiplanar CT image reconstructions and MIPs were obtained to evaluate the vascular anatomy. CONTRAST:  75 mL Isovue 370. COMPARISON:  None. FINDINGS: Cardiovascular: Thoracic aorta shows no aneurysmal dilatation or dissection. Cardiac structures are mildly enlarged. No significant coronary calcifications are seen. Pulmonary artery shows a normal branching pattern without definitive filling defect to suggest pulmonary embolism. Mediastinum/Nodes: Small hilar lymph nodes are noted likely of reactive nature. The thoracic inlet is within normal limits. Lungs/Pleura: Lungs are well aerated bilaterally but demonstrate patchy infiltrates consistent with multifocal pneumonia. This is most marked in the anterior aspect of the right upper lobe. Upper Abdomen: Within normal limits. Musculoskeletal: Within normal limits Review of the MIP images confirms the above findings. IMPRESSION: Patchy bilateral infiltrates. No evidence of pulmonary emboli. Electronically Signed   By: Alcide Clever M.D.   On: 10/23/2016 12:25   US Venous Img Lower Bilateral  Result Date: 10/23/2016 CLINICAL DATA:  Right leg pain and edema for the past month. Shortness of breath for 1 day. Evaluate for DVT EXAM: BILATERAL LOWER EXTREMITY VENOUS DOPPLER ULTRASOUND TECHNIQUE: Gray-scale sonography with graded compression, as well as color Doppler and duplex ultrasound were performed to evaluate the lower extremity deep venous systems from the level of the common femoral vein and including the common femoral, femoral, profunda femoral, popliteal and calf veins including the posterior tibial, peroneal and gastrocnemius veins when visible. The superficial great saphenous vein was also interrogated. Spectral Doppler was utilized to evaluate flow at rest and with distal augmentation maneuvers in the common femoral, femoral and popliteal veins. COMPARISON:  None. FINDINGS: RIGHT LOWER EXTREMITY Common Femoral Vein: No evidence of thrombus. Normal  compressibility, respiratory phasicity and response to augmentation. Saphenofemoral Junction: No evidence of thrombus. Normal compressibility and flow on color Doppler imaging. Profunda Femoral Vein: No evidence of thrombus. Normal compressibility and flow on color Doppler imaging. Femoral Vein: No evidence of thrombus. Normal compressibility, respiratory phasicity and response to augmentation. Popliteal Vein: No evidence of thrombus. Normal compressibility, respiratory phasicity and response to augmentation. Calf Veins: No evidence of thrombus. Normal compressibility and flow on color Doppler imaging. Superficial Great Saphenous Vein: No evidence of thrombus. Normal compressibility and flow on color Doppler imaging. Venous Reflux:  None. Other Findings:  None. LEFT LOWER EXTREMITY Common Femoral Vein: No evidence of thrombus. Normal compressibility, respiratory phasicity and response to augmentation. Saphenofemoral Junction: No evidence of thrombus. Normal compressibility and flow on color Doppler imaging. Profunda Femoral Vein: No evidence of thrombus. Normal compressibility and flow on color Doppler imaging. Femoral Vein: No evidence of thrombus. Normal compressibility, respiratory phasicity and  response to augmentation. Popliteal Vein: No evidence of thrombus. Normal compressibility, respiratory phasicity and response to augmentation. Calf Veins: No evidence of thrombus. Normal compressibility and flow on color Doppler imaging. Superficial Great Saphenous Vein: No evidence of thrombus. Normal compressibility and flow on color Doppler imaging. Venous Reflux:  None. Other Findings:  None. IMPRESSION: No evidence of DVT within either lower extremity. Electronically Signed   By: Simonne ComeJohn  Watts M.D.   On: 10/23/2016 13:05     Management plans discussed with the patient, family and they are in agreement.  CODE STATUS:     Code Status Orders        Start     Ordered   10/22/16 2124  Full code  Continuous      10/22/16 2123    Code Status History    Date Active Date Inactive Code Status Order ID Comments User Context   This patient has a current code status but no historical code status.      TOTAL TIME TAKING CARE OF THIS PATIENT: 40 minutes.    Krizia Flight M.D on 10/24/2016 at 9:19 AM  Between 7am to 6pm - Pager - [phone] After 6pm go to [email or web address] - Social research officer, governmentpassword [email or web address]  [facility]  Office  [phone]  CC: Primary care physician; No PCP Per Patient

## 2016-11-02 ENCOUNTER — Ambulatory Visit: Payer: Self-pay | Attending: Family | Admitting: Family

## 2016-11-02 ENCOUNTER — Encounter: Payer: Self-pay | Admitting: Family

## 2016-11-02 DIAGNOSIS — F17210 Nicotine dependence, cigarettes, uncomplicated: Secondary | ICD-10-CM | POA: Insufficient documentation

## 2016-11-02 DIAGNOSIS — Z0001 Encounter for general adult medical examination with abnormal findings: Secondary | ICD-10-CM | POA: Insufficient documentation

## 2016-11-02 DIAGNOSIS — I10 Essential (primary) hypertension: Secondary | ICD-10-CM

## 2016-11-02 DIAGNOSIS — Z720 Tobacco use: Secondary | ICD-10-CM

## 2016-11-02 DIAGNOSIS — I5032 Chronic diastolic (congestive) heart failure: Secondary | ICD-10-CM | POA: Insufficient documentation

## 2016-11-02 DIAGNOSIS — I110 Hypertensive heart disease with heart failure: Secondary | ICD-10-CM | POA: Insufficient documentation

## 2016-11-02 DIAGNOSIS — R0683 Snoring: Secondary | ICD-10-CM | POA: Insufficient documentation

## 2016-11-02 DIAGNOSIS — Z7982 Long term (current) use of aspirin: Secondary | ICD-10-CM | POA: Insufficient documentation

## 2016-11-02 DIAGNOSIS — Z79899 Other long term (current) drug therapy: Secondary | ICD-10-CM | POA: Insufficient documentation

## 2016-11-02 NOTE — Patient Instructions (Addendum)
Begin weighing daily and call for an overnight weight gain of > 2 pounds or a weekly weight gain of >5 pounds.  Contact Patient Financial Services: [phone]

## 2016-11-02 NOTE — Progress Notes (Signed)
Patient ID: Katrina Stout, female    DOB: 1981-07-05, 36 y.o.   MRN: [id number]  HPI  Katrina Stout is a 36 y/o female with a history of HTN, anemia, COPD, recent tobacco use and chronic heart failure.  Last echo was done 10/23/16 and showed an EF of 55-60% along with mild MR and increased filling pressure (grade 2 diastolic dysfunction).  Admitted 10/22/16 with new onset heart failure symptoms along with COPD and acute bronchitis. Was given steroids, nebulizers and antibiotics. Initially started on IV diuretics and transitioned to oral diuretics with a loss of 6.1 liters of fluid. Recommended outpatient sleep study. Was in the ED on 09/19/16 due to HTN and peripheral edema. Was treated and discharged home.   She presents today for her initial visit with fatigue with moderate exertion. Denies any shortness of breath or swelling in her legs or abdomen. Does not have any scales at home and is currently without any medical insurance. Recently stopped smoking.   Past Medical History:  Diagnosis Date  . Anemia   . CHF (congestive heart failure) (HCC)   . COPD (chronic obstructive pulmonary disease) (HCC)   . Eczema   . H/O medication noncompliance   . Hypertension   . Morbid obesity (HCC)    Past Surgical History:  Procedure Laterality Date  . TYMPANOSTOMY TUBE PLACEMENT     Family History  Problem Relation Age of Onset  . Heart attack Mother   . Hypertension Father   . Heart attack Maternal Grandmother   . Heart failure Maternal Grandmother    Social History  Substance Use Topics  . Smoking status: Former Smoker    Packs/day: 1.00    Quit date: 10/26/2016  . Smokeless tobacco: Never Used  . Alcohol use No   No Known Allergies  Prior to Admission medications   Medication Sig Start Date End Date Taking? Authorizing Provider  amLODipine (NORVASC) 10 MG tablet Take 1 tablet (10 mg total) by mouth daily. 10/24/16  Yes Enedina Finner, MD  amoxicillin-clavulanate (AUGMENTIN) 875-125 MG  tablet Take 1 tablet by mouth every 12 (twelve) hours. 10/24/16  Yes Enedina Finner, MD  aspirin 81 MG chewable tablet Chew 1 tablet (81 mg total) by mouth daily. 10/24/16  Yes Enedina Finner, MD  cloNIDine (CATAPRES) 0.2 MG tablet Take 1 tablet (0.2 mg total) by mouth 2 (two) times daily as needed (Hypertension). 09/19/16 09/19/17 Yes Jennye Moccasin, MD  furosemide (LASIX) 20 MG tablet Take 1 tablet (20 mg total) by mouth daily. 09/19/16  Yes Jennye Moccasin, MD  hydrALAZINE (APRESOLINE) 50 MG tablet Take 1 tablet (50 mg total) by mouth 3 (three) times daily. Patient taking differently: Take 50 mg by mouth daily.  10/24/16  Yes Enedina Finner, MD  lisinopril (PRINIVIL,ZESTRIL) 20 MG tablet Take 1 tablet (20 mg total) by mouth 2 (two) times daily. Patient taking differently: Take 20 mg by mouth daily.  10/24/16  Yes Enedina Finner, MD  predniSONE (DELTASONE) 10 MG tablet Take 50 mg daily taper by 10 mg daily thens stop 10/24/16  Yes Enedina Finner, MD     Review of Systems  Constitutional: Positive for fatigue (improving). Negative for appetite change.  HENT: Negative for congestion, postnasal drip and sore throat.   Eyes: Negative.   Respiratory: Positive for cough (infrequent). Negative for chest tightness and shortness of breath.   Cardiovascular: Negative for chest pain, palpitations and leg swelling.  Gastrointestinal: Negative for abdominal distention and abdominal pain.  Endocrine: Negative.  Genitourinary: Negative.   Musculoskeletal: Negative for back pain and neck pain.  Skin: Negative.   Allergic/Immunologic: Negative.   Neurological: Negative for dizziness and light-headedness.  Hematological: Negative for adenopathy. Does not bruise/bleed easily.  Psychiatric/Behavioral: Negative for dysphoric mood, sleep disturbance (sleeping in recliner due to comfort) and suicidal ideas. The patient is not nervous/anxious.    Vitals:   11/02/16 1003  BP: 135/64  Pulse: 93  Resp: 18  SpO2: 97%  Weight: 264 lb  (119.7 kg)  Height: 5\' 6"  (1.676 m)   Wt Readings from Last 3 Encounters:  11/02/16 264 lb (119.7 kg)  10/22/16 250 lb (113.4 kg)  09/19/16 240 lb (108.9 kg)   Lab Results  Component Value Date   CREATININE 0.69 10/23/2016   CREATININE 0.63 10/22/2016   CREATININE 0.70 09/19/2016   Physical Exam  Constitutional: She is oriented to person, place, and time. She appears well-developed and well-nourished.  HENT:  Head: Normocephalic and atraumatic.  Eyes: Conjunctivae are normal. Pupils are equal, round, and reactive to light.  Neck: Normal range of motion. Neck supple. No JVD present.  Cardiovascular: Normal rate and regular rhythm.   Pulmonary/Chest: Effort normal. She has no wheezes. She has no rales.  Abdominal: Soft. She exhibits no distension. There is no tenderness.  Musculoskeletal: She exhibits no edema or tenderness.  Neurological: She is alert and oriented to person, place, and time.  Skin: Skin is warm and dry.  Psychiatric: She has a normal mood and affect. Her behavior is normal. Thought content normal.  Nursing note and vitals reviewed.  Assessment & Plan:  1: Chronic heart failure with preserved ejection fraction- - NYHA class II - euvolemic - set of scales given to patient and she was instructed to weigh every morning, write it down and call for an overnight weight gain of >2 pounds or a weekly weight gain of >5 pounds - weight up 14 pounds since hospital admission but patient admits that she's not been very active and has only recently returned to work - not adding salt and is using Mrs. Dash. Also reading food labels so that she can keep her sodium intake to 2000mg  daily. - currently doesn't have a cardiologist because of no insurance - PharmD went in and reviewed medications with the patient - received flu vaccine for this season  2: HTN- - BP looks good today - was taking lisinopril and hydralazine daily. Reviewed hydralazine bottle and showed her that it  says to take three times daily and she says that she misunderstood that. She will begin taking it three times daily - does not have a PCP but is planning on going to [facility] after she gets her taxes back. [place]iscussed [facility] [place]iscuss[place] should be something similar [place].   3: Tobacco use- - stopped smoking 1 week ago and feels like she's doing ok with it - continued cessation discussed for 3 minutes with her  4: Snoring- - patient admits to snoring - would benefit from a sleep study to rule out sleep apnea but she's currently without insurance. Number given to patient financial services for her to call to see if she qualifies for assistance with medical expenses.  Return in 1 month or sooner for any questions/problems before then.

## 2016-11-03 DIAGNOSIS — Z720 Tobacco use: Secondary | ICD-10-CM | POA: Insufficient documentation

## 2016-11-03 DIAGNOSIS — I10 Essential (primary) hypertension: Secondary | ICD-10-CM | POA: Insufficient documentation

## 2016-11-03 DIAGNOSIS — R0683 Snoring: Secondary | ICD-10-CM | POA: Insufficient documentation

## 2016-11-03 DIAGNOSIS — I5032 Chronic diastolic (congestive) heart failure: Secondary | ICD-10-CM | POA: Insufficient documentation

## 2016-12-07 ENCOUNTER — Telehealth: Payer: Self-pay | Admitting: Family

## 2016-12-07 ENCOUNTER — Ambulatory Visit: Payer: Self-pay | Admitting: Family

## 2016-12-07 NOTE — Telephone Encounter (Signed)
Patient did not show for her [facility] appointment on 12/07/16. Will attempt to reschedule.

## 2017-02-28 ENCOUNTER — Encounter: Payer: Self-pay | Admitting: Emergency Medicine

## 2017-02-28 ENCOUNTER — Emergency Department: Payer: Self-pay

## 2017-02-28 DIAGNOSIS — R600 Localized edema: Secondary | ICD-10-CM | POA: Insufficient documentation

## 2017-02-28 DIAGNOSIS — J449 Chronic obstructive pulmonary disease, unspecified: Secondary | ICD-10-CM | POA: Insufficient documentation

## 2017-02-28 DIAGNOSIS — Z7982 Long term (current) use of aspirin: Secondary | ICD-10-CM | POA: Insufficient documentation

## 2017-02-28 DIAGNOSIS — I110 Hypertensive heart disease with heart failure: Secondary | ICD-10-CM | POA: Insufficient documentation

## 2017-02-28 DIAGNOSIS — I5032 Chronic diastolic (congestive) heart failure: Secondary | ICD-10-CM | POA: Insufficient documentation

## 2017-02-28 DIAGNOSIS — Z79899 Other long term (current) drug therapy: Secondary | ICD-10-CM | POA: Insufficient documentation

## 2017-02-28 DIAGNOSIS — Z87891 Personal history of nicotine dependence: Secondary | ICD-10-CM | POA: Insufficient documentation

## 2017-02-28 LAB — CBC
HCT: 34.9 % — ABNORMAL LOW (ref 35.0–47.0)
HEMOGLOBIN: 11.4 g/dL — AB (ref 12.0–16.0)
MCH: 26.4 pg (ref 26.0–34.0)
MCHC: 32.7 g/dL (ref 32.0–36.0)
MCV: 80.7 fL (ref 80.0–100.0)
Platelets: 334 10*3/uL (ref 150–440)
RBC: 4.32 MIL/uL (ref 3.80–5.20)
RDW: 16.1 % — ABNORMAL HIGH (ref 11.5–14.5)
WBC: 8.1 10*3/uL (ref 3.6–11.0)

## 2017-02-28 LAB — BASIC METABOLIC PANEL
ANION GAP: 8 (ref 5–15)
BUN: 12 mg/dL (ref 6–20)
CALCIUM: 9.3 mg/dL (ref 8.9–10.3)
CO2: 27 mmol/L (ref 22–32)
Chloride: 106 mmol/L (ref 101–111)
Creatinine, Ser: 0.7 mg/dL (ref 0.44–1.00)
Glucose, Bld: 126 mg/dL — ABNORMAL HIGH (ref 65–99)
Potassium: 3.4 mmol/L — ABNORMAL LOW (ref 3.5–5.1)
Sodium: 141 mmol/L (ref 135–145)

## 2017-02-28 LAB — TROPONIN I

## 2017-02-28 NOTE — ED Triage Notes (Signed)
Pt to triage with slow steady gait, no distress notes. Pt reports having swelling in lower extremities x3 weeks, worsening in the last 3 days. Pt has HX of CHF, hypertension and COPD. Pt denies SOB and chest pain.

## 2017-03-01 ENCOUNTER — Emergency Department
Admission: EM | Admit: 2017-03-01 | Discharge: 2017-03-01 | Disposition: A | Discharge status: Home / Self Care | Payer: Self-pay | Attending: Emergency Medicine | Admitting: Emergency Medicine

## 2017-03-01 DIAGNOSIS — R609 Edema, unspecified: Secondary | ICD-10-CM

## 2017-03-01 NOTE — ED Provider Notes (Signed)
[facility] Emergency Department Provider Note  Time seen: 2:03 AM  I have reviewed the triage vital signs and the nursing notes.   HISTORY  Chief Complaint Leg Swelling    HPI Katrina Stout is a 36 y.o. female with a past medical history of anemia, CHF, COPD, hypertension, obesity, presents to the emergency department for increased peripheral edema. According to the patient over the past one week she has had increased edema of her lower extremities. Patient takes Lasix 20 mg daily. Denies any chest pain or trouble breathing. States she only came because her daughter made her come get evaluated. Patient does state mild pain in her legs or swelling increases. States her swelling decreases if she is able to elevate her legs and rest.  Past Medical History:  Diagnosis Date  . Anemia   . CHF (congestive heart failure) (HCC)   . COPD (chronic obstructive pulmonary disease) (HCC)   . Eczema   . H/O medication noncompliance   . Hypertension   . Morbid obesity [facility])     Patient Active Problem List   Diagnosis Date Noted  . Chronic diastolic heart failure (HCC) 11/03/2016  . HTN (hypertension) 11/03/2016  . Tobacco abuse 11/03/2016  . Snoring 11/03/2016  . Sinus tachycardia 10/23/2016  . Bronchitis 10/23/2016  . Morbid obesity (HCC) 10/23/2016    Past Surgical History:  Procedure Laterality Date  . TYMPANOSTOMY TUBE PLACEMENT      Prior to Admission medications   Medication Sig Start Date End Date Taking? Authorizing Provider  amLODipine (NORVASC) 10 MG tablet Take 1 tablet (10 mg total) by mouth daily. 10/24/16   Enedina Finner, MD  amoxicillin-clavulanate (AUGMENTIN) 875-125 MG tablet Take 1 tablet by mouth every 12 (twelve) hours. 10/24/16   Enedina Finner, MD  aspirin 81 MG chewable tablet Chew 1 tablet (81 mg total) by mouth daily. 10/24/16   Enedina Finner, MD  cloNIDine (CATAPRES) 0.2 MG tablet Take 1 tablet (0.2 mg total) by mouth 2 (two) times daily as  needed (Hypertension). 09/19/16 09/19/17  Jennye Moccasin, MD  furosemide (LASIX) 20 MG tablet Take 1 tablet (20 mg total) by mouth daily. 09/19/16   Jennye Moccasin, MD  hydrALAZINE (APRESOLINE) 50 MG tablet Take 1 tablet (50 mg total) by mouth 3 (three) times daily. Patient taking differently: Take 50 mg by mouth daily.  10/24/16   Enedina Finner, MD  lisinopril (PRINIVIL,ZESTRIL) 20 MG tablet Take 1 tablet (20 mg total) by mouth 2 (two) times daily. Patient taking differently: Take 20 mg by mouth daily.  10/24/16   Enedina Finner, MD  predniSONE (DELTASONE) 10 MG tablet Take 50 mg daily taper by 10 mg daily thens stop 10/24/16   Enedina Finner, MD    No Known Allergies  Family History  Problem Relation Age of Onset  . Heart attack Mother   . Hypertension Father   . Heart attack Maternal Grandmother   . Heart failure Maternal Grandmother     Social History Social History  Substance Use Topics  . Smoking status: Former Smoker    Packs/day: 1.00    Quit date: 10/26/2016  . Smokeless tobacco: Never Used  . Alcohol use No    Review of Systems Constitutional: Negative for fever. Cardiovascular: Negative for chest pain. Respiratory: Negative for shortness of breath. Gastrointestinal: Negative for abdominal pain MusculoskeletalLower extremity edema bilaterally Skin: Negative for rash. All other ROS negative  ____________________________________________   PHYSICAL EXAM:  VITAL SIGNS: ED Triage Vitals [02/28/17  2209]  Enc Vitals Group     BP (!) 160/76     Pulse Rate (!) 110     Resp 17     Temp 98 F (36.7 C)     Temp Source Oral     SpO2 100 %     Weight 260 lb (117.9 kg)     Height 5\' 6"  (1.676 m)     Head Circumference      Peak Flow      Pain Score      Pain Loc      Pain Edu?      Excl. in GC?     Constitutional: Alert and oriented. Well appearing and in no distress. Eyes: Normal exam ENT   Head: Normocephalic and atraumatic.   Mouth/Throat: Mucous membranes  are moist. Cardiovascular: Normal rate, regular rhythm. No murmur Respiratory: Normal respiratory effort without tachypnea nor retractions. Breath sounds are clear  Gastrointestinal: Soft and nontender. No distention.   Musculoskeletal: Nontender with normal range of motion in all extremities. 2+ lower extremity edema, equal bilaterally. Nontender. No erythema. No signs of cellulitis. Neurologic:  Normal speech and language. No gross focal neurologic deficits  Skin:  Skin is warm, dry and intact.  Psychiatric: Mood and affect are normal.   ____________________________________________    EKG  EKG reviewed and interpreted by myself shows sinus tachycardia 107 bpm, narrow QRS, normal axis, normal intervals, nonspecific ST changes without ST elevation.  ____________________________________________    RADIOLOGY  Chest x-ray shows peribronchial thickening. No edema.  ____________________________________________   INITIAL IMPRESSION / ASSESSMENT AND PLAN / ED COURSE  Pertinent labs & imaging results that were available during my care of the patient were reviewed by me and considered in my medical decision making (see chart for details).  Patient presents the emergency department for increased lower extremity edema. States she followed up with heart failure clinic in the past but has not followed up in quite some time. Denies any chest pain or trouble breathing. Patient medical workup is largely negative. Labs are largely within normal limits per troponin negative. Chest x-ray is clear. EKG is reassuring. I discussed with the patient following up with heart failure clinic this week, we will double the patient some Lasix to 40 mg daily 5 days.  ____________________________________________   FINAL CLINICAL IMPRESSION(S) / ED DIAGNOSES  Peripheral edema    Minna AntisPaduchowski, Kamsiyochukwu Buist, MD 03/01/17 0206

## 2017-03-01 NOTE — Discharge Instructions (Signed)
Please W or Lasix to 40 mg daily for the next 5 days. Please follow-up with heart failure clinic this week. Return to the emergency department for any chest pain or trouble breathing, or any other symptom personally concerning to yourself.

## 2017-03-03 ENCOUNTER — Telehealth: Payer: Self-pay

## 2017-03-03 NOTE — Telephone Encounter (Signed)
Attempted to reach patient to schedule appointment and both numbers listed for patient are invalid. Will attempt to mail letter.  Thank you for the referral

## 2017-03-03 NOTE — Telephone Encounter (Signed)
-----   Message from Delma Freezeina A Hackney, [place] sent at 03/01/2017  8:33 AM EDT ----- Regarding: ED referral/please call Contact: [phone] Was in the ED 6/11 and MD wants her to be seen this week.

## 2017-08-08 ENCOUNTER — Emergency Department
Admission: EM | Admit: 2017-08-08 | Discharge: 2017-08-08 | Disposition: A | Discharge status: Home / Self Care | Payer: Self-pay | Attending: Emergency Medicine | Admitting: Emergency Medicine

## 2017-08-08 ENCOUNTER — Encounter: Payer: Self-pay | Admitting: Emergency Medicine

## 2017-08-08 ENCOUNTER — Other Ambulatory Visit: Payer: Self-pay

## 2017-08-08 DIAGNOSIS — I110 Hypertensive heart disease with heart failure: Secondary | ICD-10-CM | POA: Insufficient documentation

## 2017-08-08 DIAGNOSIS — H7292 Unspecified perforation of tympanic membrane, left ear: Secondary | ICD-10-CM | POA: Insufficient documentation

## 2017-08-08 DIAGNOSIS — Z7982 Long term (current) use of aspirin: Secondary | ICD-10-CM | POA: Insufficient documentation

## 2017-08-08 DIAGNOSIS — I5032 Chronic diastolic (congestive) heart failure: Secondary | ICD-10-CM | POA: Insufficient documentation

## 2017-08-08 DIAGNOSIS — Z79899 Other long term (current) drug therapy: Secondary | ICD-10-CM | POA: Insufficient documentation

## 2017-08-08 DIAGNOSIS — Z87891 Personal history of nicotine dependence: Secondary | ICD-10-CM | POA: Insufficient documentation

## 2017-08-08 MED ORDER — POLYMYXIN B-TRIMETHOPRIM 10000-0.1 UNIT/ML-% OP SOLN: 1.0000 [drp] | OPHTHALMIC | 0 refills | Status: AC

## 2017-08-08 NOTE — ED Triage Notes (Signed)
C/O left ear pain and pressure x 1 week.  Patient states she was seen by PCP last week, and saw fluid in ear.  No antibiotics started

## 2017-08-08 NOTE — ED Provider Notes (Signed)
[facility] Emergency Department Provider Note  ____________________________________________  Time seen: Approximately 8:12 PM  I have reviewed the triage vital signs and the nursing notes.   HISTORY  Chief Complaint Otalgia    HPI Katrina Stout is a 36 y.o. female presents to the emergency department with a feeling of left ear pressure and hearing "muffled sounds".  Patient reports that she was evaluated by her primary care provider 1 week ago, who diagnosed her with a left middle ear effusion.  Patient reports that she has had discharge from the ear.  She denies fever and chills.  No alleviating measures have been attempted.   Past Medical History:  Diagnosis Date  . Anemia   . CHF (congestive heart failure) (HCC)   . COPD (chronic obstructive pulmonary disease) (HCC)   . Eczema   . H/O medication noncompliance   . Hypertension   . Morbid obesity [facility])     Patient Active Problem List   Diagnosis Date Noted  . Chronic diastolic heart failure (HCC) 11/03/2016  . HTN (hypertension) 11/03/2016  . Tobacco abuse 11/03/2016  . Snoring 11/03/2016  . Sinus tachycardia 10/23/2016  . Bronchitis 10/23/2016  . Morbid obesity (HCC) 10/23/2016    Past Surgical History:  Procedure Laterality Date  . TYMPANOSTOMY TUBE PLACEMENT      Prior to Admission medications   Medication Sig Start Date End Date Taking? Authorizing Provider  amLODipine (NORVASC) 10 MG tablet Take 1 tablet (10 mg total) by mouth daily. 10/24/16   Enedina Finner, MD  amoxicillin-clavulanate (AUGMENTIN) 875-125 MG tablet Take 1 tablet by mouth every 12 (twelve) hours. 10/24/16   Enedina Finner, MD  aspirin 81 MG chewable tablet Chew 1 tablet (81 mg total) by mouth daily. 10/24/16   Enedina Finner, MD  cloNIDine (CATAPRES) 0.2 MG tablet Take 1 tablet (0.2 mg total) by mouth 2 (two) times daily as needed (Hypertension). 09/19/16 09/19/17  Jennye Moccasin, MD  furosemide (LASIX) 20 MG tablet Take 1  tablet (20 mg total) by mouth daily. 09/19/16   Jennye Moccasin, MD  hydrALAZINE (APRESOLINE) 50 MG tablet Take 1 tablet (50 mg total) by mouth 3 (three) times daily. Patient taking differently: Take 50 mg by mouth daily.  10/24/16   Enedina Finner, MD  lisinopril (PRINIVIL,ZESTRIL) 20 MG tablet Take 1 tablet (20 mg total) by mouth 2 (two) times daily. Patient taking differently: Take 20 mg by mouth daily.  10/24/16   Enedina Finner, MD  predniSONE (DELTASONE) 10 MG tablet Take 50 mg daily taper by 10 mg daily thens stop 10/24/16   Enedina Finner, MD  trimethoprim-polymyxin b (POLYTRIM) ophthalmic solution Place 1 drop every 4 (four) hours for 7 days into the left ear. 08/08/17 08/15/17  Orvil Feil, PA-C    Allergies Patient has no known allergies.  Family History  Problem Relation Age of Onset  . Heart attack Mother   . Hypertension Father   . Heart attack Maternal Grandmother   . Heart failure Maternal Grandmother     Social History Social History   Tobacco Use  . Smoking status: Former Smoker    Packs/day: 1.00    Last attempt to quit: 10/26/2016    Years since quitting: 0.7  . Smokeless tobacco: Never Used  Substance Use Topics  . Alcohol use: No  . Drug use: No     Review of Systems  Constitutional: No fever/chills Eyes: No visual changes. No discharge ENT: Patient has left ear pain.  Cardiovascular: no chest pain. Respiratory: no cough. No SOB. Musculoskeletal: Negative for musculoskeletal pain. Skin: Negative for rash, abrasions, lacerations, ecchymosis. Neurological: Negative for headaches, focal weakness or numbness.   ____________________________________________   PHYSICAL EXAM:  VITAL SIGNS: ED Triage Vitals  Enc Vitals Group     BP 08/08/17 1915 136/67     Pulse Rate 08/08/17 1915 98     Resp 08/08/17 1915 18     Temp 08/08/17 1915 97.9 F (36.6 C)     Temp Source 08/08/17 1915 Oral     SpO2 08/08/17 1915 94 %     Weight 08/08/17 1847 284 lb (128.8 kg)      Height 08/08/17 1847 5\' 6"  (1.676 m)     Head Circumference --      Peak Flow --      Pain Score 08/08/17 1846 6     Pain Loc --      Pain Edu? --      Excl. in GC? --      Constitutional: Alert and oriented. Well appearing and in no acute distress. Eyes: Conjunctivae are normal. PERRL. EOMI. Head: Atraumatic. ENT:       Ears: Patient's left tympanic membrane is ruptured.  Patient's right tympanic membrane is pearly.      Nose: No congestion/rhinnorhea.      Mouth/Throat: Mucous membranes are moist.  Cardiovascular: Normal rate, regular rhythm. Normal S1 and S2.  Good peripheral circulation. Respiratory: Normal respiratory effort without tachypnea or retractions. Lungs CTAB. Good air entry to the bases with no decreased or absent breath sounds. Musculoskeletal: Full range of motion to all extremities. No gross deformities appreciated. Neurologic:  Normal speech and language. No gross focal neurologic deficits are appreciated.  Skin:  Skin is warm, dry and intact. No rash noted. Psychiatric: Mood and affect are normal. Speech and behavior are normal. Patient exhibits appropriate insight and judgement.   ____________________________________________   LABS (all labs ordered are listed, but only abnormal results are displayed)  Labs Reviewed - No data to display ____________________________________________  EKG   ____________________________________________  RADIOLOGY  No results found.  ____________________________________________    PROCEDURES  Procedure(s) performed:    Procedures    Medications - No data to display   ____________________________________________   INITIAL IMPRESSION / ASSESSMENT AND PLAN / ED COURSE  Pertinent labs & imaging results that were available during my care of the patient were reviewed by me and considered in my medical decision making (see chart for details).  Review of the [place] CSRS was performed in accordance of the [facility]  prior to dispensing any controlled drugs.    Assessment and plan Otalgia Patient presents to the emergency department with left otalgia for approximately 1 week.  On physical exam, patient's left tympanic membrane was ruptured.  No physical exam findings were consistent with otitis media at this time.  Patient was discharged with Polytrim.  Patient was advised to follow-up with primary care as needed.  All patient questions were answered.   ____________________________________________  FINAL CLINICAL IMPRESSION(S) / ED DIAGNOSES  Final diagnoses:  Perforation of left tympanic membrane      NEW MEDICATIONS STARTED DURING THIS VISIT:  ED Discharge Orders        Ordered    trimethoprim-polymyxin b (POLYTRIM) ophthalmic solution  Every 4 hours     08/08/17 2009          This chart was dictated using voice recognition software/Dragon. Despite best efforts to proofread, errors can occur  which can change the meaning. Any change was purely unintentional.    Orvil FeilWoods, Aly Hauser M, PA-C 08/08/17 2016    Sharman CheekStafford, Phillip, MD 08/10/17 [phone]

## 2017-10-03 ENCOUNTER — Other Ambulatory Visit: Payer: Self-pay

## 2017-10-03 ENCOUNTER — Encounter: Payer: Self-pay | Admitting: Emergency Medicine

## 2017-10-03 ENCOUNTER — Emergency Department
Admission: EM | Admit: 2017-10-03 | Discharge: 2017-10-03 | Disposition: A | Discharge status: Home / Self Care | Payer: Medicaid Other | Attending: Emergency Medicine | Admitting: Emergency Medicine

## 2017-10-03 DIAGNOSIS — J449 Chronic obstructive pulmonary disease, unspecified: Secondary | ICD-10-CM | POA: Insufficient documentation

## 2017-10-03 DIAGNOSIS — Z79899 Other long term (current) drug therapy: Secondary | ICD-10-CM | POA: Insufficient documentation

## 2017-10-03 DIAGNOSIS — K2100 Gastro-esophageal reflux disease with esophagitis, without bleeding: Secondary | ICD-10-CM

## 2017-10-03 DIAGNOSIS — I110 Hypertensive heart disease with heart failure: Secondary | ICD-10-CM | POA: Insufficient documentation

## 2017-10-03 DIAGNOSIS — Z87891 Personal history of nicotine dependence: Secondary | ICD-10-CM | POA: Insufficient documentation

## 2017-10-03 DIAGNOSIS — I5032 Chronic diastolic (congestive) heart failure: Secondary | ICD-10-CM | POA: Insufficient documentation

## 2017-10-03 MED ORDER — GI COCKTAIL ~~LOC~~
ORAL | Status: AC
  Filled 2017-10-03: qty 30

## 2017-10-03 MED ORDER — GI COCKTAIL ~~LOC~~
30.0000 mL | Freq: Once | ORAL | Status: AC
  Administered 2017-10-03: 30 mL via ORAL

## 2017-10-03 MED ORDER — OMEPRAZOLE 10 MG PO CPDR: 20.0000 mg | DELAYED_RELEASE_CAPSULE | Freq: Every day | ORAL | 0 refills | Status: AC

## 2017-10-03 NOTE — ED Notes (Signed)
First Nurse Note: Pt ambulatory to ED in NAD, pt states that last night she had severe heartburn. This morning she feels like there is a "ball of acid" in her throat.

## 2017-10-03 NOTE — ED Notes (Signed)

## 2017-10-03 NOTE — ED Notes (Addendum)
Noted entered in patients chart in error.

## 2017-10-03 NOTE — ED Provider Notes (Signed)
[facility] Emergency Department Provider Note  ____________________________________________  Time seen: Approximately 10:21 AM  I have reviewed the triage vital signs and the nursing notes.   HISTORY  Chief Complaint Sore Throat    HPI Katrina Stout is a 37 y.o. female that presents to the emergency department for throat irritation for one morning. Last night, she felt her stomach bubbling and instead of taking her heartburn medication, she went to bed. This morning she woke up and it felt like her throat was "purring like a cat" and there is "a ball of acid" in her throat. It does not feel like her throat is closing. She has been eating a lot of acidic foods recently and had pizza over the weekend and tomatoes last night. She is not allergic to anything. No fever, chills, SOB, CP, nausea, vomiting, abdominal pain, nausea, vomiting.    Past Medical History:  Diagnosis Date  . Anemia   . CHF (congestive heart failure) (HCC)   . COPD (chronic obstructive pulmonary disease) (HCC)   . Eczema   . H/O medication noncompliance   . Hypertension   . Morbid obesity [facility])     Patient Active Problem List   Diagnosis Date Noted  . Chronic diastolic heart failure (HCC) 11/03/2016  . HTN (hypertension) 11/03/2016  . Tobacco abuse 11/03/2016  . Snoring 11/03/2016  . Sinus tachycardia 10/23/2016  . Bronchitis 10/23/2016  . Morbid obesity (HCC) 10/23/2016    Past Surgical History:  Procedure Laterality Date  . TYMPANOSTOMY TUBE PLACEMENT      Prior to Admission medications   Medication Sig Start Date End Date Taking? Authorizing Provider  amLODipine (NORVASC) 10 MG tablet Take 1 tablet (10 mg total) by mouth daily. 10/24/16   Enedina Finner, MD  amoxicillin-clavulanate (AUGMENTIN) 875-125 MG tablet Take 1 tablet by mouth every 12 (twelve) hours. 10/24/16   Enedina Finner, MD  aspirin 81 MG chewable tablet Chew 1 tablet (81 mg total) by mouth daily. 10/24/16   Enedina Finner, MD  cloNIDine (CATAPRES) 0.2 MG tablet Take 1 tablet (0.2 mg total) by mouth 2 (two) times daily as needed (Hypertension). 09/19/16 09/19/17  Jennye Moccasin, MD  furosemide (LASIX) 20 MG tablet Take 1 tablet (20 mg total) by mouth daily. 09/19/16   Jennye Moccasin, MD  hydrALAZINE (APRESOLINE) 50 MG tablet Take 1 tablet (50 mg total) by mouth 3 (three) times daily. Patient taking differently: Take 50 mg by mouth daily.  10/24/16   Enedina Finner, MD  lisinopril (PRINIVIL,ZESTRIL) 20 MG tablet Take 1 tablet (20 mg total) by mouth 2 (two) times daily. Patient taking differently: Take 20 mg by mouth daily.  10/24/16   Enedina Finner, MD  omeprazole (PRILOSEC) 10 MG capsule Take 2 capsules (20 mg total) by mouth daily. 10/03/17 11/02/17  Enid Derry, PA-C  predniSONE (DELTASONE) 10 MG tablet Take 50 mg daily taper by 10 mg daily thens stop 10/24/16   Enedina Finner, MD    Allergies Patient has no known allergies.  Family History  Problem Relation Age of Onset  . Heart attack Mother   . Hypertension Father   . Heart attack Maternal Grandmother   . Heart failure Maternal Grandmother     Social History Social History   Tobacco Use  . Smoking status: Former Smoker    Packs/day: 1.00    Last attempt to quit: 10/26/2016    Years since quitting: 0.9  . Smokeless tobacco: Never Used  Substance Use Topics  .  Alcohol use: No  . Drug use: No     Review of Systems  Constitutional: No fever/chills Cardiovascular: No chest pain. Respiratory: No cough. No SOB. Gastrointestinal: No abdominal pain.  No nausea, no vomiting.  Musculoskeletal: Negative for musculoskeletal pain. Skin: Negative for rash, abrasions, lacerations, ecchymosis.   ____________________________________________   PHYSICAL EXAM:  VITAL SIGNS: ED Triage Vitals  Enc Vitals Group     BP 10/03/17 0916 (!) 154/82     Pulse Rate 10/03/17 0916 96     Resp 10/03/17 0916 16     Temp 10/03/17 0916 98.1 F (36.7 C)     Temp src  --      SpO2 10/03/17 0916 96 %     Weight 10/03/17 0916 294 lb (133.4 kg)     Height 10/03/17 0916 5\' 6"  (1.676 m)     Head Circumference --      Peak Flow --      Pain Score 10/03/17 0915 0     Pain Loc --      Pain Edu? --      Excl. in GC? --      Constitutional: Alert and oriented. Well appearing and in no acute distress. Eyes: Conjunctivae are normal. PERRL. EOMI. Head: Atraumatic. ENT:      Ears:      Nose: No congestion/rhinnorhea.      Mouth/Throat: Mucous membranes are moist. Oropharynx non erythematous. Tonsils not enlarged.  Neck: No stridor.  No lymphadenopathy. Cardiovascular: Normal rate, regular rhythm.  Good peripheral circulation. Respiratory: Normal respiratory effort without tachypnea or retractions. Lungs CTAB. Good air entry to the bases with no decreased or absent breath sounds. Gastrointestinal: Bowel sounds 4 quadrants. Soft and nontender to palpation. No guarding or rigidity. No palpable masses. No distention.  Musculoskeletal: Full range of motion to all extremities. No gross deformities appreciated. Neurologic:  Normal speech and language. No gross focal neurologic deficits are appreciated.  Skin:  Skin is warm, dry and intact. No rash noted.   ____________________________________________   LABS (all labs ordered are listed, but only abnormal results are displayed)  Labs Reviewed - No data to display ____________________________________________  EKG   ____________________________________________  RADIOLOGY  No results found.  ____________________________________________    PROCEDURES  Procedure(s) performed:    Procedures    Medications  gi cocktail (Maalox,Lidocaine,Donnatal) (30 mLs Oral Given 10/03/17 1023)     ____________________________________________   INITIAL IMPRESSION / ASSESSMENT AND PLAN / ED COURSE  Pertinent labs & imaging results that were available during my care of the patient were reviewed by me and  considered in my medical decision making (see chart for details).  Review of the [place] CSRS was performed in accordance of the NCM[employer] prior to dispensing any controlled drugs.   Patient's diagnosis is consistent with heartburn.  Vital signs and exam are reassuring.  Symptoms resolved with GI cocktail.  She denies any shortness of breath, chest pain, abdominal pain.  Patient will be discharged home with prescriptions for omeprazole. Patient is to follow up with PCP as directed. Patient is given ED precautions to return to the ED for any worsening or new symptoms.     ____________________________________________  FINAL CLINICAL IMPRESSION(S) / ED DIAGNOSES  Final diagnoses:  Gastroesophageal reflux disease with esophagitis      NEW MEDICATIONS STARTED DURING THIS VISIT:  ED Discharge Orders        Ordered    omeprazole (PRILOSEC) 10 MG capsule  Daily     10/03/17  1126          This chart was dictated using voice recognition software/Dragon. Despite best efforts to proofread, errors can occur which can change the meaning. Any change was purely unintentional.    Enid DerryWagner, Marijean Montanye, PA-C 10/03/17 1142    Dionne BucySiadecki, Sebastian, MD 10/03/17 [phone]

## 2017-10-03 NOTE — ED Triage Notes (Signed)
Arrives with c/o some indigestion last night when going to bed.  Awoke this morning with c/o sore throat and swollen tonsils and uvula.

## 2017-10-29 ENCOUNTER — Encounter: Payer: Self-pay | Admitting: Emergency Medicine

## 2017-10-29 ENCOUNTER — Emergency Department
Admission: EM | Admit: 2017-10-29 | Discharge: 2017-10-29 | Disposition: A | Discharge status: Home / Self Care | Payer: Medicaid Other | Attending: Emergency Medicine | Admitting: Emergency Medicine

## 2017-10-29 ENCOUNTER — Other Ambulatory Visit: Payer: Self-pay

## 2017-10-29 DIAGNOSIS — J449 Chronic obstructive pulmonary disease, unspecified: Secondary | ICD-10-CM | POA: Insufficient documentation

## 2017-10-29 DIAGNOSIS — I5032 Chronic diastolic (congestive) heart failure: Secondary | ICD-10-CM | POA: Insufficient documentation

## 2017-10-29 DIAGNOSIS — I110 Hypertensive heart disease with heart failure: Secondary | ICD-10-CM | POA: Insufficient documentation

## 2017-10-29 DIAGNOSIS — Z79899 Other long term (current) drug therapy: Secondary | ICD-10-CM | POA: Insufficient documentation

## 2017-10-29 DIAGNOSIS — Z87891 Personal history of nicotine dependence: Secondary | ICD-10-CM | POA: Insufficient documentation

## 2017-10-29 DIAGNOSIS — J0100 Acute maxillary sinusitis, unspecified: Secondary | ICD-10-CM | POA: Insufficient documentation

## 2017-10-29 MED ORDER — PREDNISONE 10 MG PO TABS: 40.0000 mg | ORAL_TABLET | Freq: Every day | ORAL | 0 refills | Status: AC

## 2017-10-29 NOTE — ED Notes (Signed)
See triage note  Presents with cold sx's subjective fever and cough for the past few days  Afebrile on arrival

## 2017-10-29 NOTE — ED Triage Notes (Signed)
Pt with flu like sx.  

## 2017-10-29 NOTE — ED Provider Notes (Signed)
[facility] Emergency Department Provider Note  ____________________________________________  Time seen: Approximately 11:36 AM  I have reviewed the triage vital signs and the nursing notes.   HISTORY  Chief Complaint Generalized Body Aches    HPI Katrina Stout is a 37 y.o. female that presents to the emergency department for evaluation of nasal congestion and mucus in throat for 3 days.  Patient states she missed work yesterday and today due to feeling sick.  She states that everyone at work is sick.  No fever, chills, cough, shortness of breath.   Past Medical History:  Diagnosis Date  . Anemia   . CHF (congestive heart failure) (HCC)   . COPD (chronic obstructive pulmonary disease) (HCC)   . Eczema   . H/O medication noncompliance   . Hypertension   . Morbid obesity [facility])     Patient Active Problem List   Diagnosis Date Noted  . Chronic diastolic heart failure (HCC) 11/03/2016  . HTN (hypertension) 11/03/2016  . Tobacco abuse 11/03/2016  . Snoring 11/03/2016  . Sinus tachycardia 10/23/2016  . Bronchitis 10/23/2016  . Morbid obesity (HCC) 10/23/2016    Past Surgical History:  Procedure Laterality Date  . TYMPANOSTOMY TUBE PLACEMENT      Prior to Admission medications   Medication Sig Start Date End Date Taking? Authorizing Provider  amLODipine (NORVASC) 10 MG tablet Take 1 tablet (10 mg total) by mouth daily. 10/24/16   Enedina Finner, MD  amoxicillin-clavulanate (AUGMENTIN) 875-125 MG tablet Take 1 tablet by mouth every 12 (twelve) hours. 10/24/16   Enedina Finner, MD  aspirin 81 MG chewable tablet Chew 1 tablet (81 mg total) by mouth daily. 10/24/16   Enedina Finner, MD  cloNIDine (CATAPRES) 0.2 MG tablet Take 1 tablet (0.2 mg total) by mouth 2 (two) times daily as needed (Hypertension). 09/19/16 09/19/17  Jennye Moccasin, MD  furosemide (LASIX) 20 MG tablet Take 1 tablet (20 mg total) by mouth daily. 09/19/16   Jennye Moccasin, MD  hydrALAZINE  (APRESOLINE) 50 MG tablet Take 1 tablet (50 mg total) by mouth 3 (three) times daily. Patient taking differently: Take 50 mg by mouth daily.  10/24/16   Enedina Finner, MD  lisinopril (PRINIVIL,ZESTRIL) 20 MG tablet Take 1 tablet (20 mg total) by mouth 2 (two) times daily. Patient taking differently: Take 20 mg by mouth daily.  10/24/16   Enedina Finner, MD  omeprazole (PRILOSEC) 10 MG capsule Take 2 capsules (20 mg total) by mouth daily. 10/03/17 11/02/17  Enid Derry, PA-C  predniSONE (DELTASONE) 10 MG tablet Take 4 tablets (40 mg total) by mouth daily for 4 days. 10/29/17 11/02/17  Enid Derry, PA-C    Allergies Patient has no known allergies.  Family History  Problem Relation Age of Onset  . Heart attack Mother   . Hypertension Father   . Heart attack Maternal Grandmother   . Heart failure Maternal Grandmother     Social History Social History   Tobacco Use  . Smoking status: Former Smoker    Packs/day: 1.00    Last attempt to quit: 10/26/2016    Years since quitting: 1.0  . Smokeless tobacco: Never Used  Substance Use Topics  . Alcohol use: No  . Drug use: No     Review of Systems  Constitutional: No fever/chills Eyes: No visual changes. No discharge. ENT: Positive for congestion and rhinorrhea. Cardiovascular: No chest pain. Respiratory: Negative for cough. No SOB. Gastrointestinal: No abdominal pain.  No nausea, no vomiting.  No diarrhea.  No constipation. Musculoskeletal: Negative for musculoskeletal pain. Skin: Negative for rash, abrasions, lacerations, ecchymosis.   ____________________________________________   PHYSICAL EXAM:  VITAL SIGNS: ED Triage Vitals  Enc Vitals Group     BP 10/29/17 1005 (!) 145/65     Pulse Rate 10/29/17 1005 95     Resp 10/29/17 1005 20     Temp 10/29/17 1005 97.8 F (36.6 C)     Temp Source 10/29/17 1005 Oral     SpO2 10/29/17 1005 95 %     Weight 10/29/17 1005 290 lb (131.5 kg)     Height 10/29/17 1005 5\' 7"  (1.702 m)     Head  Circumference --      Peak Flow --      Pain Score 10/29/17 0953 3     Pain Loc --      Pain Edu? --      Excl. in GC? --      Constitutional: Alert and oriented. Well appearing and in no acute distress. Eyes: Conjunctivae are normal. PERRL. EOMI. No discharge. Head: Atraumatic. ENT: Maxillary sinus tenderness.      Ears: Tympanic membranes pearly gray with good landmarks. No discharge.      Nose: Mild congestion/rhinnorhea.      Mouth/Throat: Mucous membranes are moist. Oropharynx non-erythematous. Tonsils not enlarged. No exudates. Uvula midline. Neck: No stridor.   Hematological/Lymphatic/Immunilogical: No cervical lymphadenopathy. Cardiovascular: Normal rate, regular rhythm.  Good peripheral circulation. Respiratory: Normal respiratory effort without tachypnea or retractions. Lungs CTAB. Good air entry to the bases with no decreased or absent breath sounds. Gastrointestinal: Bowel sounds 4 quadrants. Soft and nontender to palpation. No guarding or rigidity. No palpable masses. No distention. Musculoskeletal: Full range of motion to all extremities. No gross deformities appreciated. Neurologic:  Normal speech and language. No gross focal neurologic deficits are appreciated.  Skin:  Skin is warm, dry and intact. No rash noted.   ____________________________________________   LABS (all labs ordered are listed, but only abnormal results are displayed)  Labs Reviewed - No data to display ____________________________________________  EKG   ____________________________________________  RADIOLOGY   No results found.  ____________________________________________    PROCEDURES  Procedure(s) performed:    Procedures    Medications - No data to display   ____________________________________________   INITIAL IMPRESSION / ASSESSMENT AND PLAN / ED COURSE  Pertinent labs & imaging results that were available during my care of the patient were reviewed by me and  considered in my medical decision making (see chart for details).  Review of the [place] CSRS was performed in accordance of the [facility] prior to dispensing any controlled drugs.   Patient's diagnosis is consistent with sinusitis. Vital signs and exam are reassuring. Patient appears well and is staying well hydrated. Patient feels comfortable going home. Patient will be discharged home with prescriptions for prednisone. Patient is to follow up with PCP as needed or otherwise directed. Patient is given ED precautions to return to the ED for any worsening or new symptoms.     ____________________________________________  FINAL CLINICAL IMPRESSION(S) / ED DIAGNOSES  Final diagnoses:  Acute non-recurrent maxillary sinusitis      NEW MEDICATIONS STARTED DURING THIS VISIT:  ED Discharge Orders        Ordered    predniSONE (DELTASONE) 10 MG tablet  Daily     10/29/17 1148          This chart was dictated using voice recognition software/Dragon. Despite best efforts to proofread, errors  can occur which can change the meaning. Any change was purely unintentional.    Enid Derry, PA-C 10/29/17 1526    Governor Rooks, MD 10/29/17 1534

## 2018-07-21 ENCOUNTER — Emergency Department: Payer: Medicaid Other

## 2018-07-21 ENCOUNTER — Other Ambulatory Visit: Payer: Self-pay

## 2018-07-21 ENCOUNTER — Emergency Department
Admission: EM | Admit: 2018-07-21 | Discharge: 2018-07-22 | Disposition: A | Discharge status: Home / Self Care | Payer: Medicaid Other | Attending: Emergency Medicine | Admitting: Emergency Medicine

## 2018-07-21 ENCOUNTER — Encounter: Payer: Self-pay | Admitting: *Deleted

## 2018-07-21 DIAGNOSIS — Z87891 Personal history of nicotine dependence: Secondary | ICD-10-CM | POA: Insufficient documentation

## 2018-07-21 DIAGNOSIS — I110 Hypertensive heart disease with heart failure: Secondary | ICD-10-CM | POA: Insufficient documentation

## 2018-07-21 DIAGNOSIS — Z79899 Other long term (current) drug therapy: Secondary | ICD-10-CM | POA: Insufficient documentation

## 2018-07-21 DIAGNOSIS — I50813 Acute on chronic right heart failure: Secondary | ICD-10-CM

## 2018-07-21 DIAGNOSIS — J449 Chronic obstructive pulmonary disease, unspecified: Secondary | ICD-10-CM | POA: Insufficient documentation

## 2018-07-21 DIAGNOSIS — I5033 Acute on chronic diastolic (congestive) heart failure: Secondary | ICD-10-CM | POA: Insufficient documentation

## 2018-07-21 DIAGNOSIS — J209 Acute bronchitis, unspecified: Secondary | ICD-10-CM | POA: Insufficient documentation

## 2018-07-21 LAB — BASIC METABOLIC PANEL
Anion gap: 8 (ref 5–15)
BUN: 9 mg/dL (ref 6–20)
CALCIUM: 8.7 mg/dL — AB (ref 8.9–10.3)
CO2: 26 mmol/L (ref 22–32)
Chloride: 105 mmol/L (ref 98–111)
Creatinine, Ser: 0.6 mg/dL (ref 0.44–1.00)
GFR calc Af Amer: >60 mL/min (ref >60)
GLUCOSE: 145 mg/dL — AB (ref 70–99)
Potassium: 3.4 mmol/L — ABNORMAL LOW (ref 3.5–5.1)
Sodium: 139 mmol/L (ref 135–145)

## 2018-07-21 LAB — CBC
HCT: 30.7 % — ABNORMAL LOW (ref 36.0–46.0)
HEMOGLOBIN: 8.5 g/dL — AB (ref 12.0–15.0)
MCH: 19.8 pg — AB (ref 26.0–34.0)
MCHC: 27.7 g/dL — AB (ref 30.0–36.0)
MCV: 71.4 fL — ABNORMAL LOW (ref 80.0–100.0)
PLATELETS: 383 10*3/uL (ref 150–400)
RBC: 4.3 MIL/uL (ref 3.87–5.11)
RDW: 20.6 % — ABNORMAL HIGH (ref 11.5–15.5)
WBC: 9.5 10*3/uL (ref 4.0–10.5)
nRBC: 0 % (ref 0.0–0.2)

## 2018-07-21 LAB — TROPONIN I

## 2018-07-21 MED ORDER — FUROSEMIDE 10 MG/ML IJ SOLN
40.0000 mg | Freq: Once | INTRAMUSCULAR | Status: AC
  Administered 2018-07-22: 40 mg via INTRAVENOUS
  Filled 2018-07-21: qty 4

## 2018-07-21 MED ORDER — PREDNISONE 20 MG PO TABS
60.0000 mg | ORAL_TABLET | Freq: Once | ORAL | Status: AC
  Administered 2018-07-22: 60 mg via ORAL
  Filled 2018-07-21: qty 3

## 2018-07-21 MED ORDER — AZITHROMYCIN 500 MG PO TABS
500.0000 mg | ORAL_TABLET | Freq: Once | ORAL | Status: AC
  Administered 2018-07-22: 500 mg via ORAL
  Filled 2018-07-21: qty 1

## 2018-07-21 NOTE — ED Triage Notes (Signed)
Pt to ED reporting chest tightness and rib pain with congestion x 2 days. SOB intermittently. Pt able to speak in complete sentences and appears to have no increased WOB. No fevers dry cough and nasal congestion.

## 2018-07-21 NOTE — ED Provider Notes (Signed)
[facility] Emergency Department Provider Note    First MD Initiated Contact with Patient 07/21/18 2329     (approximate)  I have reviewed the triage vital signs and the nursing notes.   HISTORY  Chief Complaint Chest Pain and Shortness of Breath    HPI Katrina Stout is a 37 y.o. female with below list of chronic medical conditions including COPD and CHF presents to the emergency department with 2-day history of nonproductive cough nasal congestion and "chest tightness.  Patient denies any fever.  Patient denies any lower extreme knee pain or swelling.  Patient denies any chest pain at present.  Patient states symptoms consistent with previous episodes of bronchitis.  Of note patient does admit to smoking half pack of cigarettes per day.  In addition patient does admit to medication noncompliance with Lasix as she states that she misplaced it however found it and took a dose yesterday.   Past Medical History:  Diagnosis Date  . Anemia   . CHF (congestive heart failure) (HCC)   . COPD (chronic obstructive pulmonary disease) (HCC)   . Eczema   . H/O medication noncompliance   . Hypertension   . Morbid obesity [facility])     Patient Active Problem List   Diagnosis Date Noted  . Chronic diastolic heart failure (HCC) 11/03/2016  . HTN (hypertension) 11/03/2016  . Tobacco abuse 11/03/2016  . Snoring 11/03/2016  . Sinus tachycardia 10/23/2016  . Bronchitis 10/23/2016  . Morbid obesity (HCC) 10/23/2016    Past Surgical History:  Procedure Laterality Date  . TYMPANOSTOMY TUBE PLACEMENT      Prior to Admission medications   Medication Sig Start Date End Date Taking? Authorizing Provider  amLODipine (NORVASC) 10 MG tablet Take 1 tablet (10 mg total) by mouth daily. 10/24/16   Enedina Finner, MD  amoxicillin-clavulanate (AUGMENTIN) 875-125 MG tablet Take 1 tablet by mouth every 12 (twelve) hours. 10/24/16   Enedina Finner, MD  aspirin 81 MG chewable tablet Chew 1  tablet (81 mg total) by mouth daily. 10/24/16   Enedina Finner, MD  cloNIDine (CATAPRES) 0.2 MG tablet Take 1 tablet (0.2 mg total) by mouth 2 (two) times daily as needed (Hypertension). 09/19/16 09/19/17  Jennye Moccasin, MD  furosemide (LASIX) 20 MG tablet Take 1 tablet (20 mg total) by mouth daily. 09/19/16   Jennye Moccasin, MD  hydrALAZINE (APRESOLINE) 50 MG tablet Take 1 tablet (50 mg total) by mouth 3 (three) times daily. Patient taking differently: Take 50 mg by mouth daily.  10/24/16   Enedina Finner, MD  lisinopril (PRINIVIL,ZESTRIL) 20 MG tablet Take 1 tablet (20 mg total) by mouth 2 (two) times daily. Patient taking differently: Take 20 mg by mouth daily.  10/24/16   Enedina Finner, MD  omeprazole (PRILOSEC) 10 MG capsule Take 2 capsules (20 mg total) by mouth daily. 10/03/17 11/02/17  Enid Derry, PA-C    Allergies No known drug allergies  Family History  Problem Relation Age of Onset  . Heart attack Mother   . Hypertension Father   . Heart attack Maternal Grandmother   . Heart failure Maternal Grandmother     Social History Social History   Tobacco Use  . Smoking status: Former Smoker    Packs/day: 1.00    Last attempt to quit: 10/26/2016    Years since quitting: 1.7  . Smokeless tobacco: Never Used  Substance Use Topics  . Alcohol use: No  . Drug use: No    Review  of Systems Constitutional: No fever/chills Eyes: No visual changes. ENT: No sore throat. Cardiovascular: Denies chest pain. Respiratory: Positive for nonproductive cough dyspnea Gastrointestinal: No abdominal pain.  No nausea, no vomiting.  No diarrhea.  No constipation. Genitourinary: Negative for dysuria. Musculoskeletal: Negative for neck pain.  Negative for back pain. Integumentary: Negative for rash. Neurological: Negative for headaches, focal weakness or numbness.   ____________________________________________   PHYSICAL EXAM:  VITAL SIGNS: ED Triage Vitals  Enc Vitals Group     BP 07/21/18  2003 (!) 154/77     Pulse Rate 07/21/18 2003 100     Resp 07/21/18 2003 16     Temp 07/21/18 2003 97.8 F (36.6 C)     Temp Source 07/21/18 2003 Oral     SpO2 07/21/18 2003 97 %     Weight 07/21/18 2004 131.5 kg (289 lb 14.5 oz)     Height --      Head Circumference --      Peak Flow --      Pain Score 07/21/18 2003 2     Pain Loc --      Pain Edu? --      Excl. in GC? --     Constitutional: Alert and oriented. Well appearing and in no acute distress. Eyes: Conjunctivae are normal.  Mouth/Throat: Mucous membranes are moist.  Oropharynx non-erythematous. Neck: No stridor.   Cardiovascular: Normal rate, regular rhythm. Good peripheral circulation. Grossly normal heart sounds. Respiratory: Normal respiratory effort.  No retractions.  Bibasilar rhonchi. Gastrointestinal: Soft and nontender. No distention.  Musculoskeletal: No lower extremity tenderness nor edema. No gross deformities of extremities. Neurologic:  Normal speech and language. No gross focal neurologic deficits are appreciated.  Skin:  Skin is warm, dry and intact. No rash noted. Psychiatric: Mood and affect are normal. Speech and behavior are normal.  ____________________________________________   LABS (all labs ordered are listed, but only abnormal results are displayed)  Labs Reviewed  BASIC METABOLIC PANEL - Abnormal; Notable for the following components:      Result Value   Potassium 3.4 (*)    Glucose, Bld 145 (*)    Calcium 8.7 (*)    All other components within normal limits  CBC - Abnormal; Notable for the following components:   Hemoglobin 8.5 (*)    HCT 30.7 (*)    MCV 71.4 (*)    MCH 19.8 (*)    MCHC 27.7 (*)    RDW 20.6 (*)    All other components within normal limits  TROPONIN I   ____________________________________________  EKG  ED ECG REPORT I, [place] N Nikky Duba, the attending physician, personally viewed and interpreted this ECG.   Date: 07/22/2018  EKG Time: 8:04 PM  Rate: 105   Rhythm: Sinus tachycardia  Axis: Normal  Intervals: Normal  ST&T Change: None  ____________________________________________  RADIOLOGY I, [place] Jidenna Figgs, personally viewed and evaluated these images (plain radiographs) as part of my medical decision making, as well as reviewing the written report by the radiologist.  ED MD interpretation: Cardiomegaly with mild vascular congestion.  Mild interstitial coarsening and peribronchial thickening consistent with previous x-ray per radiologist.  Official radiology report(s): Dg Chest 2 View  Result Date: 07/21/2018 CLINICAL DATA:  37 year old female with shortness of breath and chest tightness. EXAM: CHEST - 2 VIEW COMPARISON:  Chest radiograph dated 02/28/2017 FINDINGS: There is mild cardiomegaly and mild vascular congestion. Mild diffuse interstitial coarsening and peribronchial thickening similar to prior radiograph. No focal consolidation, pleural effusion,  or pneumothorax. No acute osseous pathology. IMPRESSION: 1. Cardiomegaly with mild vascular congestion. No focal consolidation. 2. Mild interstitial coarsening and peribronchial thickening similar to prior radiograph. Electronically Signed   By: Elgie Collard M.D.   On: 07/21/2018 21:15    _________________________  Procedures   ____________________________________________   INITIAL IMPRESSION / ASSESSMENT AND PLAN / ED COURSE  As part of my medical decision making, I reviewed the following data within the electronic MEDICAL RECORD NUMBER   37 year old female presenting with above-stated history and physical exam secondary to dyspnea and nonproductive cough.  Consider the possibility of pulmonary edema given noncompliance with Lasix.  Also consider the possibility of bronchitis given known COPD history.  Chest x-ray consistent with both.  Patient given Lasix 40 mg IV and advised to continue taking Lasix as prescribed.  In addition patient given azithromycin 500 mg and prednisone 60  mg. ____________________________________________  FINAL CLINICAL IMPRESSION(S) / ED DIAGNOSES  Final diagnoses:  Acute on chronic right-sided congestive heart failure (HCC)  Acute bronchitis, unspecified organism     MEDICATIONS GIVEN DURING THIS VISIT:  Medications  furosemide (LASIX) injection 40 mg (has no administration in time range)  azithromycin (ZITHROMAX) tablet 500 mg (has no administration in time range)  predniSONE (DELTASONE) tablet 60 mg (has no administration in time range)     ED Discharge Orders    None       Note:  This document was prepared using Dragon voice recognition software and may include unintentional dictation errors.    Darci Current, MD 07/22/18 [phone]

## 2018-07-22 MED ORDER — PREDNISONE 20 MG PO TABS: 60.0000 mg | ORAL_TABLET | Freq: Every day | ORAL | 0 refills | Status: AC

## 2018-07-22 MED ORDER — ALBUTEROL SULFATE HFA 108 (90 BASE) MCG/ACT IN AERS: 2.0000 | INHALATION_SPRAY | Freq: Four times a day (QID) | RESPIRATORY_TRACT | 0 refills | Status: AC | PRN

## 2018-07-22 MED ORDER — AZITHROMYCIN 500 MG PO TABS: 500.0000 mg | ORAL_TABLET | Freq: Every day | ORAL | 0 refills | Status: AC

## 2018-07-24 ENCOUNTER — Emergency Department: Payer: Medicaid Other

## 2018-07-24 ENCOUNTER — Other Ambulatory Visit: Payer: Self-pay

## 2018-07-24 DIAGNOSIS — J449 Chronic obstructive pulmonary disease, unspecified: Secondary | ICD-10-CM | POA: Insufficient documentation

## 2018-07-24 DIAGNOSIS — J441 Chronic obstructive pulmonary disease with (acute) exacerbation: Secondary | ICD-10-CM | POA: Insufficient documentation

## 2018-07-24 DIAGNOSIS — Z87891 Personal history of nicotine dependence: Secondary | ICD-10-CM | POA: Insufficient documentation

## 2018-07-24 DIAGNOSIS — I10 Essential (primary) hypertension: Secondary | ICD-10-CM | POA: Insufficient documentation

## 2018-07-24 DIAGNOSIS — Z79899 Other long term (current) drug therapy: Secondary | ICD-10-CM | POA: Insufficient documentation

## 2018-07-24 DIAGNOSIS — Z7982 Long term (current) use of aspirin: Secondary | ICD-10-CM | POA: Insufficient documentation

## 2018-07-24 DIAGNOSIS — I5032 Chronic diastolic (congestive) heart failure: Secondary | ICD-10-CM | POA: Insufficient documentation

## 2018-07-24 LAB — CBC
HCT: 30.1 % — ABNORMAL LOW (ref 36.0–46.0)
Hemoglobin: 8 g/dL — ABNORMAL LOW (ref 12.0–15.0)
MCH: 19.5 pg — AB (ref 26.0–34.0)
MCHC: 26.6 g/dL — AB (ref 30.0–36.0)
MCV: 73.2 fL — AB (ref 80.0–100.0)
PLATELETS: 400 10*3/uL (ref 150–400)
RBC: 4.11 MIL/uL (ref 3.87–5.11)
RDW: 21 % — ABNORMAL HIGH (ref 11.5–15.5)
WBC: 14.1 10*3/uL — ABNORMAL HIGH (ref 4.0–10.5)
nRBC: 0.2 % (ref 0.0–0.2)

## 2018-07-24 LAB — TROPONIN I

## 2018-07-24 LAB — BASIC METABOLIC PANEL
Anion gap: 8 (ref 5–15)
BUN: 13 mg/dL (ref 6–20)
CO2: 25 mmol/L (ref 22–32)
CREATININE: 0.71 mg/dL (ref 0.44–1.00)
Calcium: 8.6 mg/dL — ABNORMAL LOW (ref 8.9–10.3)
Chloride: 106 mmol/L (ref 98–111)
GFR calc Af Amer: >60 mL/min (ref >60)
GLUCOSE: 240 mg/dL — AB (ref 70–99)
Potassium: 4.2 mmol/L (ref 3.5–5.1)
SODIUM: 139 mmol/L (ref 135–145)

## 2018-07-24 NOTE — ED Triage Notes (Signed)
Pt states SOB and cough. Here Thursday and was dx with bronchitis. Pt states she feels worse. Been on steroids and antibiotics. States quit smoking 2 days ago. Denies fever. A&O, speaking in complete sentences, no resp distress noted. Ambulatory. Denies face congestion.

## 2018-07-25 ENCOUNTER — Other Ambulatory Visit: Payer: Self-pay

## 2018-07-25 ENCOUNTER — Emergency Department
Admission: EM | Admit: 2018-07-25 | Discharge: 2018-07-25 | Disposition: A | Discharge status: Home / Self Care | Payer: Medicaid Other | Attending: Emergency Medicine | Admitting: Emergency Medicine

## 2018-07-25 DIAGNOSIS — J441 Chronic obstructive pulmonary disease with (acute) exacerbation: Secondary | ICD-10-CM

## 2018-07-25 MED ORDER — BENZONATATE 100 MG PO CAPS: 100.0000 mg | ORAL_CAPSULE | Freq: Three times a day (TID) | ORAL | 0 refills | Status: DC | PRN

## 2018-07-25 MED ORDER — IPRATROPIUM-ALBUTEROL 0.5-2.5 (3) MG/3ML IN SOLN
3.0000 mL | Freq: Once | RESPIRATORY_TRACT | Status: AC
  Administered 2018-07-25: 3 mL via RESPIRATORY_TRACT

## 2018-07-25 MED ORDER — IPRATROPIUM-ALBUTEROL 0.5-2.5 (3) MG/3ML IN SOLN
RESPIRATORY_TRACT | Status: AC
  Administered 2018-07-25: 3 mL via RESPIRATORY_TRACT
  Filled 2018-07-25: qty 6

## 2018-07-25 NOTE — ED Notes (Signed)
Pt reports started on Z Pack Thursday and took last dose today. Pt is also on steroids since Thursday.

## 2018-07-25 NOTE — ED Provider Notes (Signed)
[facility] Emergency Department Provider Note    First MD Initiated Contact with Patient 07/25/18 0122     (approximate)  I have reviewed the triage vital signs and the nursing notes.   HISTORY  Chief Complaint Shortness of Breath    HPI Katrina Stout is a 37 y.o. female with below list of chronic medical conditions including CHF and COPD presents to the emergency department with dyspnea and cough for which patient was seen on 07/21/2018 for the same.  Patient states that she is been compliant with her Lasix since then and has taken prednisone and azithromycin as prescribed patient denies any fever.  Patient denies any lower remedy pain or swelling.  Patient denies any chest pain.  Patient states that she "quit smoking cigarettes 2 days ago.   Past Medical History:  Diagnosis Date  . Anemia   . CHF (congestive heart failure) (HCC)   . COPD (chronic obstructive pulmonary disease) (HCC)   . Eczema   . H/O medication noncompliance   . Hypertension   . Morbid obesity [facility])     Patient Active Problem List   Diagnosis Date Noted  . Chronic diastolic heart failure (HCC) 11/03/2016  . HTN (hypertension) 11/03/2016  . Tobacco abuse 11/03/2016  . Snoring 11/03/2016  . Sinus tachycardia 10/23/2016  . Bronchitis 10/23/2016  . Morbid obesity (HCC) 10/23/2016    Past Surgical History:  Procedure Laterality Date  . TYMPANOSTOMY TUBE PLACEMENT      Prior to Admission medications   Medication Sig Start Date End Date Taking? Authorizing Provider  albuterol (PROVENTIL HFA;VENTOLIN HFA) 108 (90 Base) MCG/ACT inhaler Inhale 2 puffs into the lungs every 6 (six) hours as needed for wheezing or shortness of breath. 07/22/18   Darci Current, MD  amLODipine (NORVASC) 10 MG tablet Take 1 tablet (10 mg total) by mouth daily. 10/24/16   Enedina Finner, MD  amoxicillin-clavulanate (AUGMENTIN) 875-125 MG tablet Take 1 tablet by mouth every 12 (twelve) hours. 10/24/16    Enedina Finner, MD  aspirin 81 MG chewable tablet Chew 1 tablet (81 mg total) by mouth daily. 10/24/16   Enedina Finner, MD  azithromycin (ZITHROMAX) 500 MG tablet Take 1 tablet (500 mg total) by mouth daily for 3 days. Take 1 tablet daily for 3 days. 07/22/18 07/25/18  Darci Current, MD  cloNIDine (CATAPRES) 0.2 MG tablet Take 1 tablet (0.2 mg total) by mouth 2 (two) times daily as needed (Hypertension). 09/19/16 09/19/17  Jennye Moccasin, MD  furosemide (LASIX) 20 MG tablet Take 1 tablet (20 mg total) by mouth daily. 09/19/16   Jennye Moccasin, MD  hydrALAZINE (APRESOLINE) 50 MG tablet Take 1 tablet (50 mg total) by mouth 3 (three) times daily. Patient taking differently: Take 50 mg by mouth daily.  10/24/16   Enedina Finner, MD  lisinopril (PRINIVIL,ZESTRIL) 20 MG tablet Take 1 tablet (20 mg total) by mouth 2 (two) times daily. Patient taking differently: Take 20 mg by mouth daily.  10/24/16   Enedina Finner, MD  omeprazole (PRILOSEC) 10 MG capsule Take 2 capsules (20 mg total) by mouth daily. 10/03/17 11/02/17  Enid Derry, PA-C  predniSONE (DELTASONE) 20 MG tablet Take 3 tablets (60 mg total) by mouth daily for 5 days. 07/22/18 07/27/18  Darci Current, MD    Allergies Codeine  Family History  Problem Relation Age of Onset  . Heart attack Mother   . Hypertension Father   . Heart attack Maternal Grandmother   .  Heart failure Maternal Grandmother     Social History Social History   Tobacco Use  . Smoking status: Former Smoker    Packs/day: 1.00    Last attempt to quit: 07/22/2018  . Smokeless tobacco: Never Used  Substance Use Topics  . Alcohol use: No  . Drug use: No    Review of Systems Constitutional: No fever/chills Eyes: No visual changes. ENT: No sore throat. Cardiovascular: Denies chest pain. Respiratory: Positive for cough, dyspnea Gastrointestinal: No abdominal pain.  No nausea, no vomiting.  No diarrhea.  No constipation. Genitourinary: Negative for  dysuria. Musculoskeletal: Negative for neck pain.  Negative for back pain. Integumentary: Negative for rash. Neurological: Negative for headaches, focal weakness or numbness.  ____________________________________________   PHYSICAL EXAM:  VITAL SIGNS: ED Triage Vitals  Enc Vitals Group     BP 07/24/18 2106 (!) 161/85     Pulse Rate 07/24/18 2106 98     Resp 07/24/18 2106 16     Temp 07/24/18 2106 98.1 F (36.7 C)     Temp Source 07/24/18 2106 Oral     SpO2 07/24/18 2106 96 %     Weight 07/24/18 2109 136.1 kg (300 lb)     Height 07/24/18 2109 1.676 m (5\' 6" )     Head Circumference --      Peak Flow --      Pain Score 07/24/18 2108 1     Pain Loc --      Pain Edu? --      Excl. in GC? --     Constitutional: Alert and oriented. Well appearing and in no acute distress. Eyes: Conjunctivae are normal. Mouth/Throat: Mucous membranes are moist.  Oropharynx non-erythematous. Neck: No stridor. Cardiovascular: Normal rate, regular rhythm. Good peripheral circulation. Grossly normal heart sounds. Respiratory: Normal respiratory effort.  No retractions.  Mild expiratory wheezing. Gastrointestinal: Soft and nontender. No distention.  Musculoskeletal: No lower extremity tenderness nor edema. No gross deformities of extremities. Neurologic:  Normal speech and language. No gross focal neurologic deficits are appreciated.  Skin:  Skin is warm, dry and intact. No rash noted. Psychiatric: Mood and affect are normal. Speech and behavior are normal.  ____________________________________________   LABS (all labs ordered are listed, but only abnormal results are displayed)  Labs Reviewed  BASIC METABOLIC PANEL - Abnormal; Notable for the following components:      Result Value   Glucose, Bld 240 (*)    Calcium 8.6 (*)    All other components within normal limits  CBC - Abnormal; Notable for the following components:   WBC 14.1 (*)    Hemoglobin 8.0 (*)    HCT 30.1 (*)    MCV 73.2 (*)     MCH 19.5 (*)    MCHC 26.6 (*)    RDW 21.0 (*)    All other components within normal limits  TROPONIN I  POC URINE PREG, ED   ____________________________________________  EKG  ED ECG REPORT I, [place] N BROWN, the attending physician, personally viewed and interpreted this ECG.   Date: 07/25/2018  EKG Time: 9:10 PM  Rate: 97  Rhythm: Normal Sinus Rhythm  Axis: Normal  Intervals: Normal  ST&T Change: None  ____________________________________________  RADIOLOGY I, [place] N BROWN, personally viewed and evaluated these images (plain radiographs) as part of my medical decision making, as well as reviewing the written report by the radiologist.  ED MD interpretation: Stable mild vascular congestion no focal infiltrate noted.  Official radiology report(s): Dg Chest 2  View  Result Date: 07/24/2018 CLINICAL DATA:  Cough and shortness of breath EXAM: CHEST - 2 VIEW COMPARISON:  07/21/2018 FINDINGS: Cardiac shadow is stable. The central increased markings are again identified and stable. Mild edema is seen. No focal confluent infiltrate is noted. No bony abnormality is seen. IMPRESSION: Stable mild vascular congestion.  No focal infiltrate is noted. Electronically Signed   By: Alcide Clever M.D.   On: 07/24/2018 21:47      Procedures   ____________________________________________   INITIAL IMPRESSION / ASSESSMENT AND PLAN / ED COURSE  As part of my medical decision making, I reviewed the following data within the electronic MEDICAL RECORD NUMBER   37 year old female presenting with above-stated history and physical exam of cough and dyspnea.  Patient oxygen saturation 98% on my arrival to the room with no increased work of breathing.  Patient however noted to have mild expiratory wheezes and as such was given 2 DuoNeb treatments with complete resolution.  Patient stating that she feels much better now. ____________________________________________  FINAL CLINICAL IMPRESSION(S)  / ED DIAGNOSES  Final diagnoses:  COPD exacerbation (HCC)     MEDICATIONS GIVEN DURING THIS VISIT:  Medications  ipratropium-albuterol (DUONEB) 0.5-2.5 (3) MG/3ML nebulizer solution (has no administration in time range)     ED Discharge Orders    None       Note:  This document was prepared using Dragon voice recognition software and may include unintentional dictation errors.    Darci Current, MD 07/25/18 [phone]

## 2018-11-28 ENCOUNTER — Emergency Department
Admission: EM | Admit: 2018-11-28 | Discharge: 2018-11-28 | Disposition: A | Discharge status: Home / Self Care | Payer: Medicaid Other | Attending: Emergency Medicine | Admitting: Emergency Medicine

## 2018-11-28 ENCOUNTER — Other Ambulatory Visit: Payer: Self-pay

## 2018-11-28 ENCOUNTER — Encounter: Payer: Self-pay | Admitting: Intensive Care

## 2018-11-28 DIAGNOSIS — J111 Influenza due to unidentified influenza virus with other respiratory manifestations: Secondary | ICD-10-CM | POA: Insufficient documentation

## 2018-11-28 DIAGNOSIS — Z7982 Long term (current) use of aspirin: Secondary | ICD-10-CM | POA: Insufficient documentation

## 2018-11-28 DIAGNOSIS — F17210 Nicotine dependence, cigarettes, uncomplicated: Secondary | ICD-10-CM | POA: Insufficient documentation

## 2018-11-28 DIAGNOSIS — J449 Chronic obstructive pulmonary disease, unspecified: Secondary | ICD-10-CM | POA: Insufficient documentation

## 2018-11-28 DIAGNOSIS — I5032 Chronic diastolic (congestive) heart failure: Secondary | ICD-10-CM | POA: Insufficient documentation

## 2018-11-28 DIAGNOSIS — I110 Hypertensive heart disease with heart failure: Secondary | ICD-10-CM | POA: Insufficient documentation

## 2018-11-28 DIAGNOSIS — Z79899 Other long term (current) drug therapy: Secondary | ICD-10-CM | POA: Insufficient documentation

## 2018-11-28 DIAGNOSIS — R69 Illness, unspecified: Secondary | ICD-10-CM

## 2018-11-28 MED ORDER — FLUTICASONE PROPIONATE 50 MCG/ACT NA SUSP: 2.0000 | Freq: Every day | NASAL | 2 refills | Status: AC

## 2018-11-28 MED ORDER — OSELTAMIVIR PHOSPHATE 75 MG PO CAPS: 75.0000 mg | ORAL_CAPSULE | Freq: Two times a day (BID) | ORAL | 0 refills | Status: AC

## 2018-11-28 NOTE — Discharge Instructions (Signed)
Follow-up with your regular doctor if not better in 3 days.  Return emergency department worsening.  Drink plenty of fluids.

## 2018-11-28 NOTE — ED Triage Notes (Signed)
Patient c/o headache, body aches, chills, and congestion since this AM

## 2018-11-28 NOTE — ED Provider Notes (Signed)
[facility] Emergency Department Provider Note  ____________________________________________   First MD Initiated Contact with Patient 11/28/18 1634     (approximate)  I have reviewed the triage vital signs and the nursing notes.   HISTORY  Chief Complaint Influenza    HPI Katrina Stout is a 38 y.o. female flulike symptoms, patient is complained of ?fever, chills, body aches.  mild cough, denies sore throat, denies vomiting, denies diarrhea; denies chest pain or sob.  Sx for 1 days, she states she has sinus congestion and drainage with clear mucus.  She also has a headache.  She is worried she has  coronavirus but has not had any recent travel or been exposed to anyone with coronavirus.   Past Medical History:  Diagnosis Date  . Anemia   . CHF (congestive heart failure) (HCC)   . COPD (chronic obstructive pulmonary disease) (HCC)   . Eczema   . H/O medication noncompliance   . Hypertension   . Morbid obesity [facility])     Patient Active Problem List   Diagnosis Date Noted  . Chronic diastolic heart failure (HCC) 11/03/2016  . HTN (hypertension) 11/03/2016  . Tobacco abuse 11/03/2016  . Snoring 11/03/2016  . Sinus tachycardia 10/23/2016  . Bronchitis 10/23/2016  . Morbid obesity (HCC) 10/23/2016    Past Surgical History:  Procedure Laterality Date  . TYMPANOSTOMY TUBE PLACEMENT      Prior to Admission medications   Medication Sig Start Date End Date Taking? Authorizing Provider  albuterol (PROVENTIL HFA;VENTOLIN HFA) 108 (90 Base) MCG/ACT inhaler Inhale 2 puffs into the lungs every 6 (six) hours as needed for wheezing or shortness of breath. 07/22/18   Darci Current, MD  amLODipine (NORVASC) 10 MG tablet Take 1 tablet (10 mg total) by mouth daily. 10/24/16   Enedina Finner, MD  aspirin 81 MG chewable tablet Chew 1 tablet (81 mg total) by mouth daily. 10/24/16   Enedina Finner, MD  cloNIDine (CATAPRES) 0.2 MG tablet Take 1 tablet (0.2 mg total) by  mouth 2 (two) times daily as needed (Hypertension). 09/19/16 09/19/17  Jennye Moccasin, MD  fluticasone (FLONASE) 50 MCG/ACT nasal spray Place 2 sprays into both nostrils daily. 11/28/18   Fisher, Roselyn Bering, PA-C  furosemide (LASIX) 20 MG tablet Take 1 tablet (20 mg total) by mouth daily. 09/19/16   Jennye Moccasin, MD  hydrALAZINE (APRESOLINE) 50 MG tablet Take 1 tablet (50 mg total) by mouth 3 (three) times daily. Patient taking differently: Take 50 mg by mouth daily.  10/24/16   Enedina Finner, MD  lisinopril (PRINIVIL,ZESTRIL) 20 MG tablet Take 1 tablet (20 mg total) by mouth 2 (two) times daily. Patient taking differently: Take 20 mg by mouth daily.  10/24/16   Enedina Finner, MD  omeprazole (PRILOSEC) 10 MG capsule Take 2 capsules (20 mg total) by mouth daily. 10/03/17 11/02/17  Enid Derry, PA-C  oseltamivir (TAMIFLU) 75 MG capsule Take 1 capsule (75 mg total) by mouth 2 (two) times daily. 11/28/18   Faythe Ghee, PA-C    Allergies Codeine  Family History  Problem Relation Age of Onset  . Heart attack Mother   . Hypertension Father   . Heart attack Maternal Grandmother   . Heart failure Maternal Grandmother     Social History Social History   Tobacco Use  . Smoking status: Current Every Day Smoker    Packs/day: 1.00    Types: Cigarettes  . Smokeless tobacco: Never Used  Substance Use Topics  .  Alcohol use: No  . Drug use: Yes    Types: Marijuana    Review of Systems  Constitutional: Unsure fever/chills Eyes: No visual changes. ENT: Denies sore throat.  Complains of sinus congestion and drainage Respiratory: Positive cough Genitourinary: Negative for dysuria. Musculoskeletal: Negative for back pain. Skin: Negative for rash.    ____________________________________________   PHYSICAL EXAM:  VITAL SIGNS: ED Triage Vitals  Enc Vitals Group     BP 11/28/18 1620 (!) 149/68     Pulse Rate 11/28/18 1620 93     Resp 11/28/18 1620 18     Temp 11/28/18 1620 98.2 F (36.8  C)     Temp Source 11/28/18 1620 Oral     SpO2 11/28/18 1620 97 %     Weight 11/28/18 1617 300 lb (136.1 kg)     Height 11/28/18 1617 5\' 6"  (1.676 m)     Head Circumference --      Peak Flow --      Pain Score 11/28/18 1617 0     Pain Loc --      Pain Edu? --      Excl. in GC? --     Constitutional: Alert and oriented. Well appearing and in no acute distress. Eyes: Conjunctivae are normal.  Head: Atraumatic. Nose: No congestion/rhinnorhea. Mouth/Throat: Mucous membranes are moist.  Throat appears normal Neck:  supple no lymphadenopathy noted Cardiovascular: Normal rate, regular rhythm. Heart sounds are normal Respiratory: Normal respiratory effort.  No retractions, lungs c t a  GU: deferred Musculoskeletal: FROM all extremities, warm and well perfused Neurologic:  Normal speech and language.  Skin:  Skin is warm, dry and intact. No rash noted. Psychiatric: Mood and affect are normal. Speech and behavior are normal.  ____________________________________________   LABS (all labs ordered are listed, but only abnormal results are displayed)  Labs Reviewed - No data to display ____________________________________________   ____________________________________________  RADIOLOGY    ____________________________________________   PROCEDURES  Procedure(s) performed: No  Procedures    ____________________________________________   INITIAL IMPRESSION / ASSESSMENT AND PLAN / ED COURSE  Pertinent labs & imaging results that were available during my care of the patient were reviewed by me and considered in my medical decision making (see chart for details).   Patient is a 38 year old female presents emergency department flulike symptoms.  She states she is afraid she has coronavirus.  When asked about recent travel she has none.  Has she been exposed to any one that has traveled she states no.  And if asked if she is been near anyone that has been diagnosed with  coronavirus she states no to this question also.  Physical exam patient appears basically well.  Splane to to her she has cold symptoms.  She states no I know I have the flu.  Gave her a prescription for Tamiflu and Flonase.  She is to follow-up with regular doctor if not better in 3 days.     As part of my medical decision making, I reviewed the following data within the electronic MEDICAL RECORD NUMBER Nursing notes reviewed and incorporated, Old chart reviewed, Notes from prior ED visits and [place] Controlled Substance Database  ____________________________________________   FINAL CLINICAL IMPRESSION(S) / ED DIAGNOSES  Final diagnoses:  Influenza-like illness      NEW MEDICATIONS STARTED DURING THIS VISIT:  New Prescriptions   FLUTICASONE (FLONASE) 50 MCG/ACT NASAL SPRAY    Place 2 sprays into both nostrils daily.   OSELTAMIVIR (TAMIFLU) 75 MG CAPSULE  Take 1 capsule (75 mg total) by mouth 2 (two) times daily.     Note:  This document was prepared using Dragon voice recognition software and may include unintentional dictation errors.    Faythe Ghee, PA-C 11/28/18 1658    Dionne Bucy, MD 11/29/18 [phone]

## 2019-05-26 ENCOUNTER — Emergency Department: Payer: Self-pay

## 2019-05-26 ENCOUNTER — Encounter: Payer: Self-pay | Admitting: Emergency Medicine

## 2019-05-26 ENCOUNTER — Emergency Department
Admission: EM | Admit: 2019-05-26 | Discharge: 2019-05-26 | Disposition: A | Discharge status: Home / Self Care | Payer: Self-pay | Attending: Emergency Medicine | Admitting: Emergency Medicine

## 2019-05-26 ENCOUNTER — Other Ambulatory Visit: Payer: Self-pay

## 2019-05-26 DIAGNOSIS — Z7982 Long term (current) use of aspirin: Secondary | ICD-10-CM | POA: Insufficient documentation

## 2019-05-26 DIAGNOSIS — Z20828 Contact with and (suspected) exposure to other viral communicable diseases: Secondary | ICD-10-CM | POA: Insufficient documentation

## 2019-05-26 DIAGNOSIS — J069 Acute upper respiratory infection, unspecified: Secondary | ICD-10-CM | POA: Insufficient documentation

## 2019-05-26 DIAGNOSIS — Z20822 Contact with and (suspected) exposure to covid-19: Secondary | ICD-10-CM

## 2019-05-26 DIAGNOSIS — I5032 Chronic diastolic (congestive) heart failure: Secondary | ICD-10-CM | POA: Insufficient documentation

## 2019-05-26 DIAGNOSIS — J449 Chronic obstructive pulmonary disease, unspecified: Secondary | ICD-10-CM | POA: Insufficient documentation

## 2019-05-26 DIAGNOSIS — I110 Hypertensive heart disease with heart failure: Secondary | ICD-10-CM | POA: Insufficient documentation

## 2019-05-26 DIAGNOSIS — F17210 Nicotine dependence, cigarettes, uncomplicated: Secondary | ICD-10-CM | POA: Insufficient documentation

## 2019-05-26 DIAGNOSIS — R030 Elevated blood-pressure reading, without diagnosis of hypertension: Secondary | ICD-10-CM

## 2019-05-26 DIAGNOSIS — Z79899 Other long term (current) drug therapy: Secondary | ICD-10-CM | POA: Insufficient documentation

## 2019-05-26 MED ORDER — PREDNISONE 20 MG PO TABS
60.0000 mg | ORAL_TABLET | Freq: Once | ORAL | Status: AC
  Administered 2019-05-26: 60 mg via ORAL
  Filled 2019-05-26: qty 3

## 2019-05-26 MED ORDER — PREDNISONE 10 MG PO TABS: ORAL_TABLET | ORAL | 0 refills | Status: AC

## 2019-05-26 MED ORDER — BLOOD PRESSURE KIT: 1.0000 [IU] | PACK | Freq: Every day | 0 refills | Status: AC

## 2019-05-26 MED ORDER — ACETAMINOPHEN 325 MG PO TABS
650.0000 mg | ORAL_TABLET | Freq: Once | ORAL | Status: AC
  Administered 2019-05-26: 20:00:00 650 mg via ORAL
  Filled 2019-05-26: qty 2

## 2019-05-26 MED ORDER — ALBUTEROL SULFATE HFA 108 (90 BASE) MCG/ACT IN AERS
1.0000 | INHALATION_SPRAY | Freq: Once | RESPIRATORY_TRACT | Status: AC
  Administered 2019-05-26: 20:00:00 1 via RESPIRATORY_TRACT
  Filled 2019-05-26: qty 6.7

## 2019-05-26 MED ORDER — AZITHROMYCIN 250 MG PO TABS: ORAL_TABLET | ORAL | 0 refills | Status: AC

## 2019-05-26 MED ORDER — AZITHROMYCIN 500 MG PO TABS
500.0000 mg | ORAL_TABLET | Freq: Once | ORAL | Status: AC
  Administered 2019-05-26: 500 mg via ORAL
  Filled 2019-05-26: qty 1

## 2019-05-26 NOTE — ED Notes (Signed)
See triage note  States she up with scratchy throat and cough  States she does have occasional problem with allergies  No fever  But has lost her voice

## 2019-05-26 NOTE — ED Triage Notes (Signed)
Pt states she awoke with a cough early this AM and now has a sore throat and hoarse voice.  Denis fever or sick contact.

## 2019-05-26 NOTE — ED Notes (Signed)
ED Provider at bedside. 

## 2019-05-26 NOTE — ED Provider Notes (Signed)
[facility] Emergency Department Provider Note  ____________________________________________  Time seen: Approximately 6:28 PM  I have reviewed the triage vital signs and the nursing notes.   HISTORY  Chief Complaint Sore Throat    HPI Katrina Stout is a 38 y.o. female that presents to the emergency department for evaluation of hoarse voice and nonproductive cough for 1 day.  Patient woke up this morning with symptoms.  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  No sick contacts or contacts with COVID-19.  She did not take her blood pressure medications as prescribed today.  No fever, chills, sore throat, shortness of breath, chest pain, vomiting, diarrhea.   Past Medical History:  Diagnosis Date  . Anemia   . CHF (congestive heart failure) ([place])   . COPD (chronic obstructive pulmonary disease) ([place])   . Eczema   . H/O medication noncompliance   . Hypertension   . Morbid obesity [facility])     Patient Active Problem List   Diagnosis Date Noted  . Chronic diastolic heart failure ([place]) 11/03/2016  . HTN (hypertension) 11/03/2016  . Tobacco abuse 11/03/2016  . Snoring 11/03/2016  . Sinus tachycardia 10/23/2016  . Bronchitis 10/23/2016  . Morbid obesity ([place]) 10/23/2016    Past Surgical History:  Procedure Laterality Date  . TYMPANOSTOMY TUBE PLACEMENT      Prior to Admission medications   Medication Sig Start Date End Date Taking? Authorizing Provider  albuterol (PROVENTIL HFA;VENTOLIN HFA) 108 (90 Base) MCG/ACT inhaler Inhale 2 puffs into the lungs every 6 (six) hours as needed for wheezing or shortness of breath. 07/22/18   Gregor Hams, MD  amLODipine (NORVASC) 10 MG tablet Take 1 tablet (10 mg total) by mouth daily. 10/24/16   Fritzi Mandes, MD  aspirin 81 MG chewable tablet Chew 1 tablet (81 mg total) by mouth daily. 10/24/16   Fritzi Mandes, MD   azithromycin (ZITHROMAX Z-PAK) 250 MG tablet Take 2 tablets (500 mg) on  Day 1,  followed by 1 tablet (250 mg) once daily on Days 2 through 5. 05/26/19   Laban Emperor, PA-C  Blood Pressure KIT 1 Units by Does not apply route daily. 05/26/19   Laban Emperor, PA-C  cloNIDine (CATAPRES) 0.2 MG tablet Take 1 tablet (0.2 mg total) by mouth 2 (two) times daily as needed (Hypertension). 09/19/16 09/19/17  Daymon Larsen, MD  fluticasone (FLONASE) 50 MCG/ACT nasal spray Place 2 sprays into both nostrils daily. 11/28/18   Fisher, Linden Dolin, PA-C  furosemide (LASIX) 20 MG tablet Take 1 tablet (20 mg total) by mouth daily. 09/19/16   Daymon Larsen, MD  hydrALAZINE (APRESOLINE) 50 MG tablet Take 1 tablet (50 mg total) by mouth 3 (three) times daily. Patient taking differently: Take 50 mg by mouth daily.  10/24/16   Fritzi Mandes, MD  lisinopril (PRINIVIL,ZESTRIL) 20 MG tablet Take 1 tablet (20 mg total) by mouth 2 (two) times daily. Patient taking differently: Take 20 mg by mouth daily.  10/24/16   Fritzi Mandes, MD  omeprazole (PRILOSEC) 10 MG capsule Take 2 capsules (20 mg total) by mouth daily. 10/03/17 11/02/17  Laban Emperor, PA-C  oseltamivir (TAMIFLU) 75 MG capsule Take 1 capsule (75 mg total) by mouth 2 (two) times daily. 11/28/18   Fisher, Linden Dolin, PA-C  predniSONE (DELTASONE) 10 MG tablet Take 6 tablets on day 1, take 5 tablets  on day 2, take 4 tablets on day 3, take 3 tablets on day 4, take 2 tablets on day 5, take 1 tablet on day 6 05/26/19   Laban Emperor, PA-C    Allergies Codeine  Family History  Problem Relation Age of Onset  . Heart attack Mother   . Hypertension Father   . Heart attack Maternal Grandmother   . Heart failure Maternal Grandmother     Social History Social History   Tobacco Use  . Smoking status: Current Every Day Smoker    Packs/day: 1.00    Types: Cigarettes  . Smokeless tobacco: Never Used  Substance Use Topics  . Alcohol use: No  . Drug use: Yes    Types: Marijuana      Review of Systems  Constitutional: No fever/chills Eyes: No visual changes. No discharge. ENT: Negative for congestion and rhinorrhea. Cardiovascular: No chest pain. Respiratory: Positive for nonproductive cough. No SOB. Gastrointestinal: No abdominal pain.  No nausea, no vomiting.  No diarrhea.  No constipation. Musculoskeletal: Negative for musculoskeletal pain. Skin: Negative for rash, abrasions, lacerations, ecchymosis. Neurological: Negative for headaches.   ____________________________________________   PHYSICAL EXAM:  VITAL SIGNS: ED Triage Vitals  Enc Vitals Group     BP 05/26/19 1740 (!) 199/71     Pulse Rate 05/26/19 1740 96     Resp 05/26/19 1740 18     Temp 05/26/19 1740 98.5 F (36.9 C)     Temp src --      SpO2 05/26/19 1740 98 %     Weight 05/26/19 1741 (!) 310 lb (140.6 kg)     Height 05/26/19 1741 '5\' 6"'$  (1.676 m)     Head Circumference --      Peak Flow --      Pain Score 05/26/19 1740 0     Pain Loc --      Pain Edu? --      Excl. in [place]? --      Constitutional: Alert and oriented. Well appearing and in no acute distress. Eyes: Conjunctivae are normal. PERRL. EOMI. No discharge. Head: Atraumatic. ENT: No frontal and maxillary sinus tenderness.      Ears: Tympanic membranes pearly gray with good landmarks. No discharge.      Nose: No congestion/rhinnorhea.      Mouth/Throat: Mucous membranes are moist. Oropharynx non-erythematous. Tonsils not enlarged. No exudates. Uvula midline. Neck: No stridor.   Hematological/Lymphatic/Immunilogical: No cervical lymphadenopathy. Cardiovascular: Normal rate, regular rhythm.  Good peripheral circulation. Respiratory: Normal respiratory effort without tachypnea or retractions. Lungs CTAB. Good air entry to the bases with no decreased or absent breath sounds. Gastrointestinal: Bowel sounds 4 quadrants. Soft and nontender to palpation. No guarding or rigidity. No palpable masses. No  distention. Musculoskeletal: Full range of motion to all extremities. No gross deformities appreciated. Neurologic:  Normal speech and language. No gross focal neurologic deficits are appreciated.  Skin:  Skin is warm, dry and intact. No rash noted. Psychiatric: Mood and affect are normal. Speech and behavior are normal. Patient exhibits appropriate insight and judgement.   ____________________________________________   LABS (all labs ordered are listed, but only abnormal results are displayed)  Labs Reviewed  NOVEL CORONAVIRUS, NAA (HOSP ORDER, SEND-OUT TO REF LAB; TAT 18-24 HRS)   ____________________________________________  EKG   ____________________________________________  RADIOLOGY Robinette Haines, personally viewed and evaluated these images (plain radiographs) as part of my medical decision making, as well as reviewing the written report by the radiologist.  Dg Chest Portable  1 View  Result Date: 05/26/2019 CLINICAL DATA:  Cough.  Hoarseness. EXAM: PORTABLE CHEST 1 VIEW COMPARISON:  Two-view chest x-ray 07/25/2018 FINDINGS: The heart size is normal. Lung volumes are low. Mild pulmonary vascular congestion is present. Right greater than left basilar opacities are noted. IMPRESSION: 1. Mild hilar congestion bilaterally is nonspecific. 2. Right greater than left basilar opacities. While this may represent atelectasis, infection is not excluded. Electronically Signed   By: San Morelle M.D.   On: 05/26/2019 18:53    ____________________________________________    PROCEDURES  Procedure(s) performed:    Procedures    Medications  albuterol (VENTOLIN HFA) 108 (90 Base) MCG/ACT inhaler 1 puff (1 puff Inhalation Given 05/26/19 1935)  acetaminophen (TYLENOL) tablet 650 mg (650 mg Oral Given 05/26/19 1935)  azithromycin (ZITHROMAX) tablet 500 mg (500 mg Oral Given 05/26/19 1954)  predniSONE (DELTASONE) tablet 60 mg (60 mg Oral Given 05/26/19 1953)      ____________________________________________   INITIAL IMPRESSION / ASSESSMENT AND PLAN / ED COURSE  Pertinent labs & imaging results that were available during my care of the patient were reviewed by me and considered in my medical decision making (see chart for details).  Review of the [place] CSRS was performed in accordance of the [place] prior to dispensing any controlled drugs.     Patient's diagnosis is consistent with URI. Vital signs and exam are reassuring.  Chest x-ray reveals congestion and possible opacities or atelectasis.  Patient feels well over all.  I suspect that patient has COVID-19.  COVID-19 test is pending.  She will be covered also for bronchitis and pneumonia with prednisone and azithromycin.  Patient's blood pressure was elevated in the emergency department and was instructed to take her blood pressure medications as prescribed.  She was given a prescription for a blood pressure monitoring kit.  Patient appears well and is staying well hydrated. Patient feels comfortable going home. Patient will be discharged home with prescriptions for azithromycin and prednisone. Patient is to follow up with primary care as needed or otherwise directed. Patient is given ED precautions to return to the ED for any worsening or new symptoms.  Katrina Stout was evaluated in Emergency Department on 05/26/2019 for the symptoms described in the history of present illness. She was evaluated in the context of the global COVID-19 pandemic, which necessitated consideration that the patient might be at risk for infection with the SARS-CoV-2 virus that causes COVID-19. Institutional protocols and algorithms that pertain to the evaluation of patients at risk for COVID-19 are in a state of rapid change based on information released by regulatory bodies including the CDC and federal and state organizations. These policies and algorithms were followed during the patient's care in the  ED.   ____________________________________________  FINAL CLINICAL IMPRESSION(S) / ED DIAGNOSES  Final diagnoses:  URI with cough and congestion  Suspected Covid-19 Virus Infection  Elevated blood pressure reading      NEW MEDICATIONS STARTED DURING THIS VISIT:  ED Discharge Orders         Ordered    azithromycin (ZITHROMAX Z-PAK) 250 MG tablet     05/26/19 1949    predniSONE (DELTASONE) 10 MG tablet     05/26/19 1949    Blood Pressure KIT  Daily     05/26/19 1959              This chart was dictated using voice recognition software/Dragon. Despite best efforts to proofread, errors can occur which can change the meaning.  Any change was purely unintentional.    Laban Emperor, PA-C 05/26/19 2157    Harvest Dark, MD 05/26/19 [phone]

## 2019-05-28 LAB — NOVEL CORONAVIRUS, NAA (HOSP ORDER, SEND-OUT TO REF LAB; TAT 18-24 HRS): SARS-CoV-2, NAA: NOT DETECTED

## 2019-08-28 IMAGING — CR DG CHEST 2V
1 series · 2 of 2 positions shown · non-contrast
Comparison: Chest radiograph dated 02/28/2017

CLINICAL DATA: 29-year-old female with shortness of breath and
chest tightness.

EXAM:
CHEST - 2 VIEW

[Series 1: dg chest 2 view · 0.14mm/px · 2 of 2 slices shown]
[im 1/2]
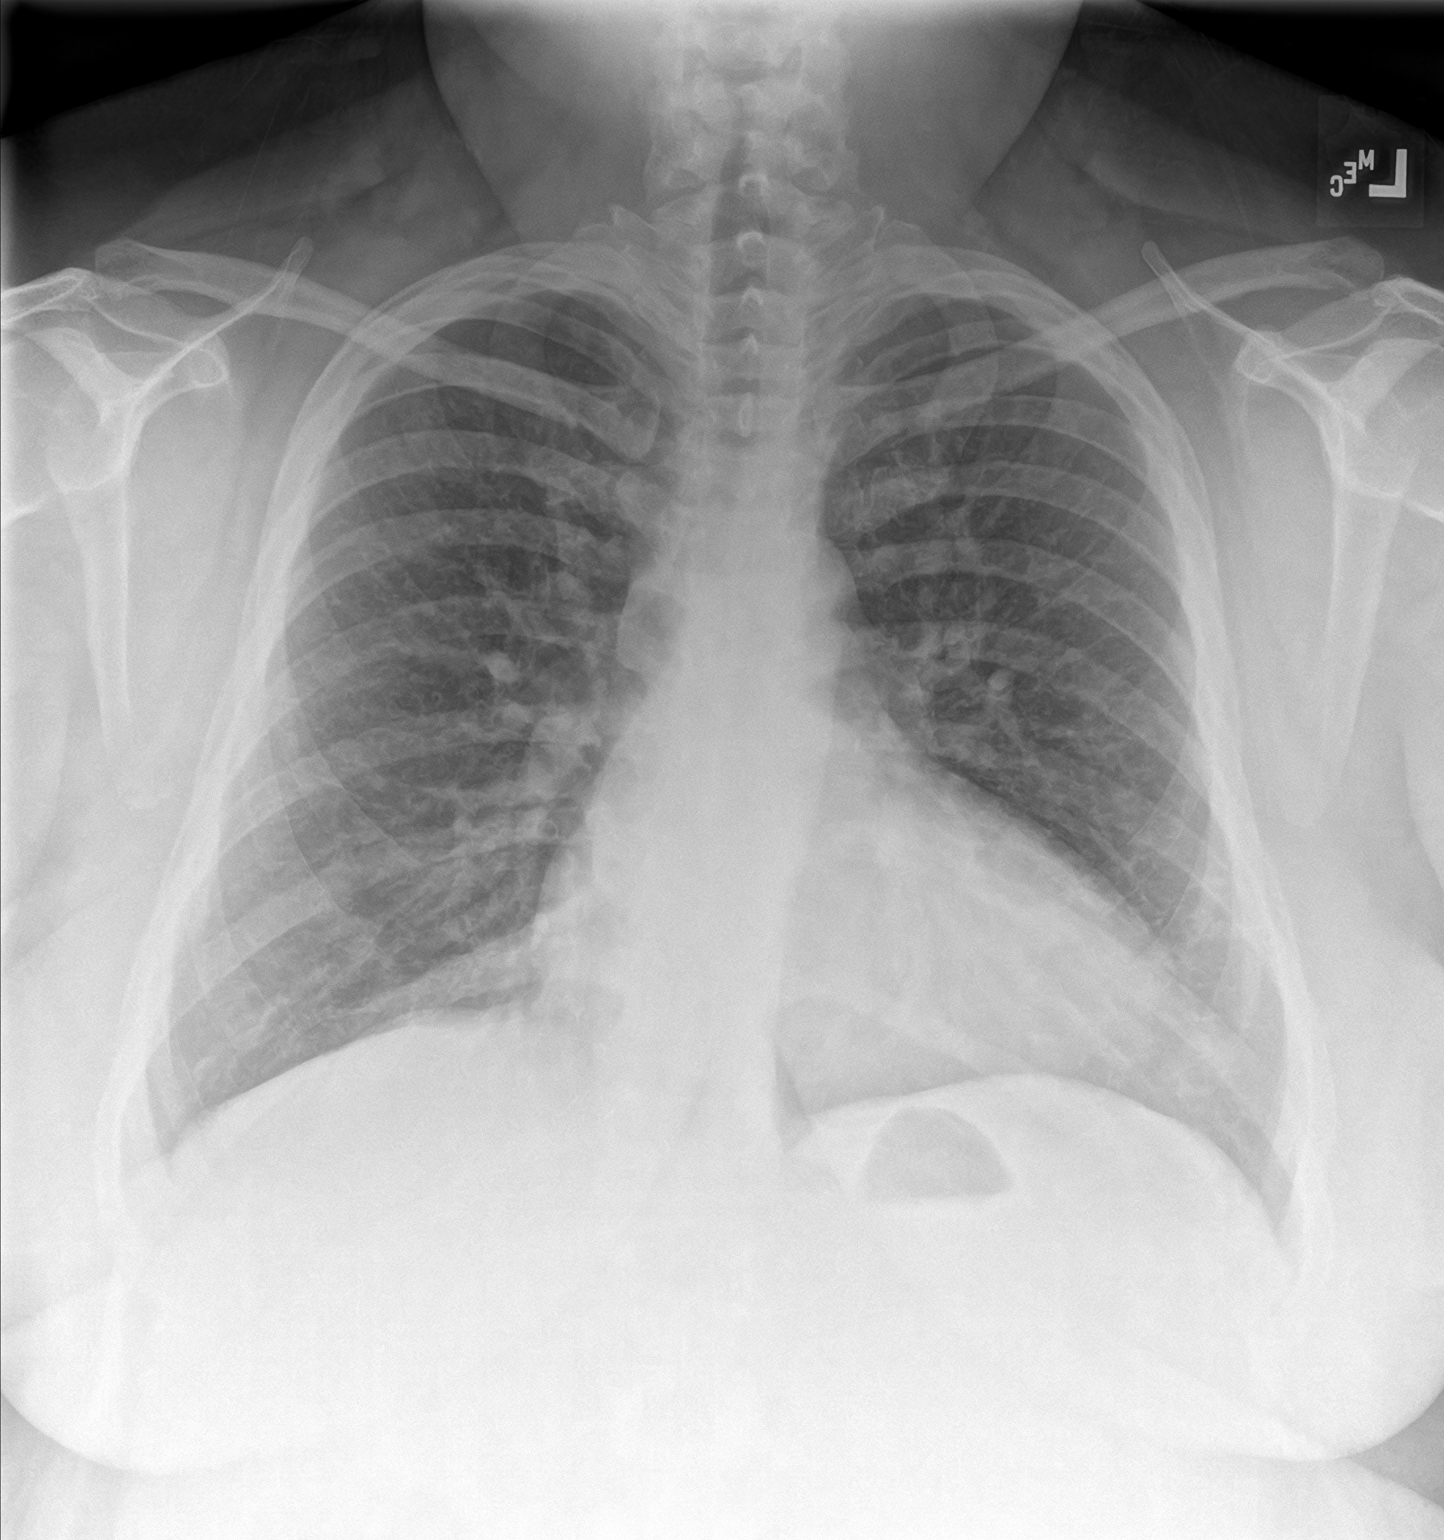
[im 2/2]
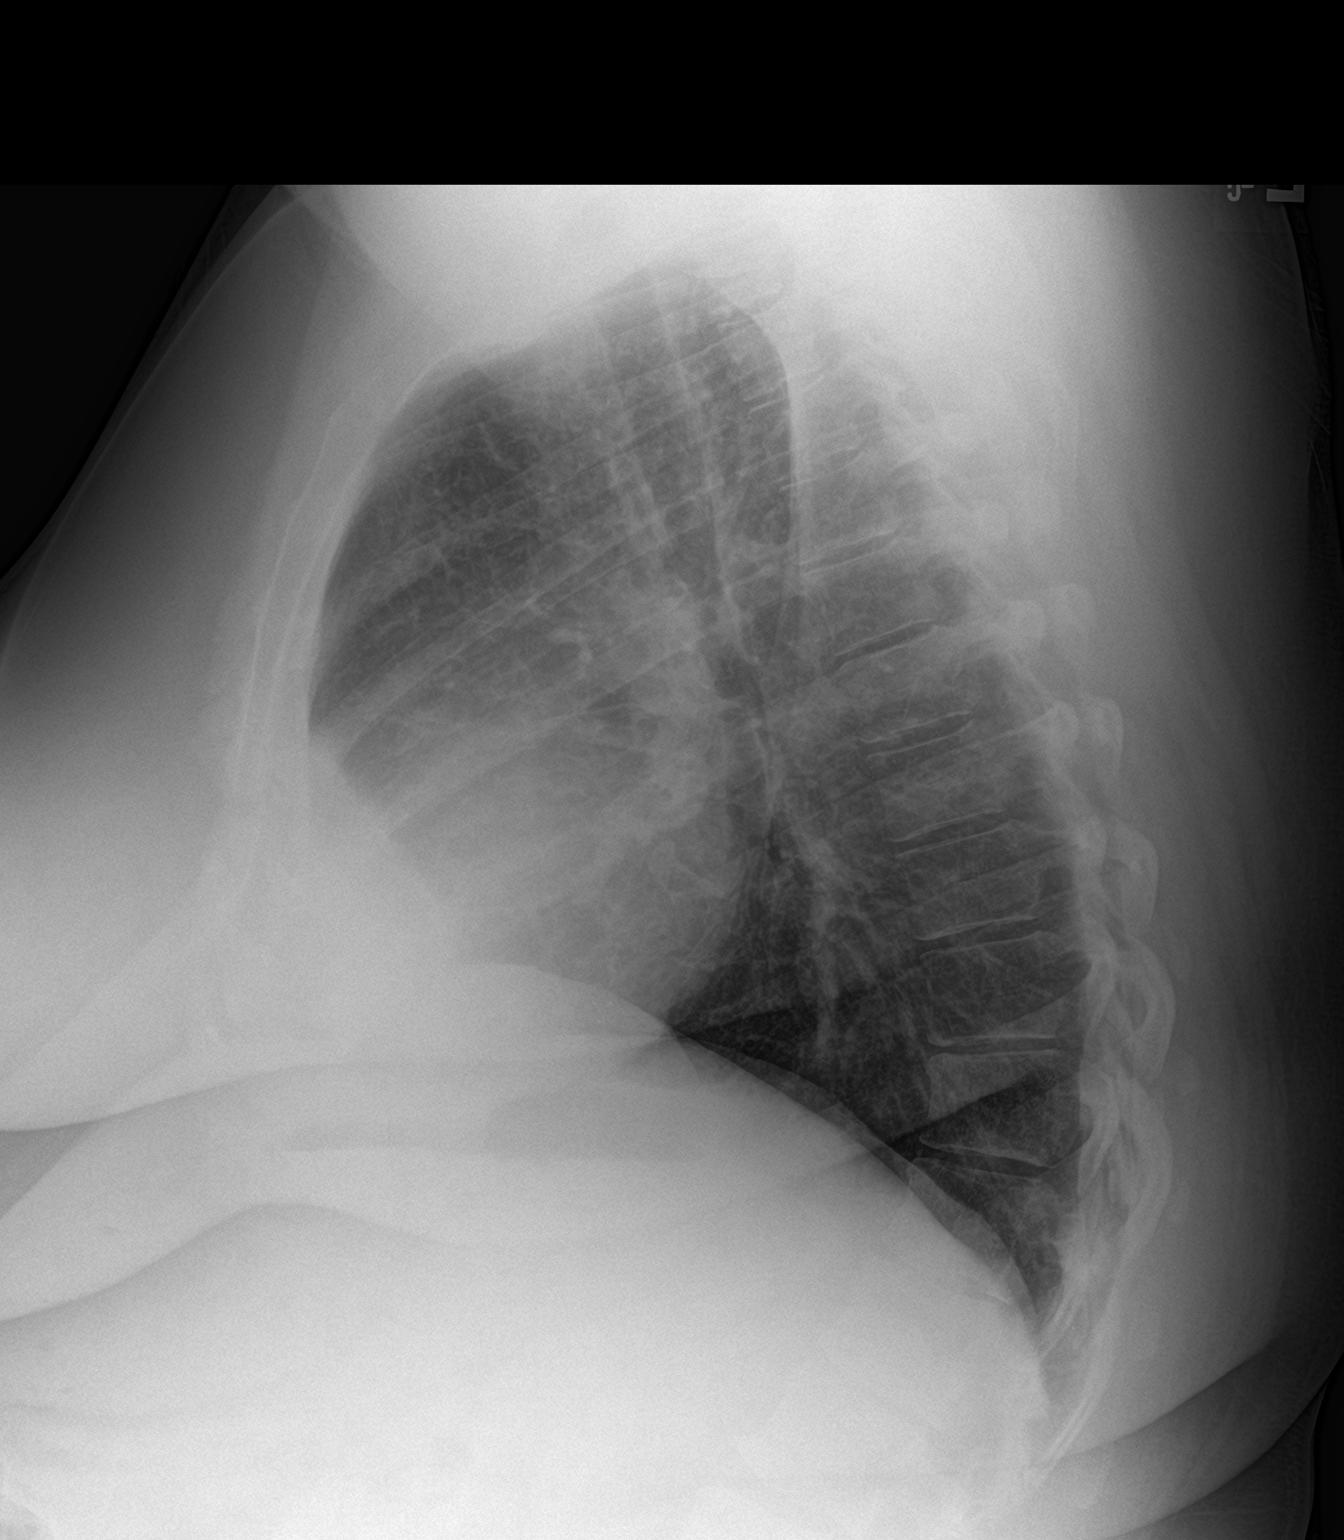

[2 of 2 positions shown; findings below may reference images not displayed]

FINDINGS: There is mild cardiomegaly and mild vascular congestion. Mild
diffuse interstitial coarsening and peribronchial thickening similar
to prior radiograph. No focal consolidation, pleural effusion, or
pneumothorax. No acute osseous pathology.
IMPRESSION: 1. Cardiomegaly with mild vascular congestion. No focal
consolidation.
2. Mild interstitial coarsening and peribronchial thickening similar
to prior radiograph.

## 2019-12-13 ENCOUNTER — Ambulatory Visit: Payer: Self-pay | Attending: Obstetrics and Gynecology

## 2021-12-16 ENCOUNTER — Ambulatory Visit (INDEPENDENT_AMBULATORY_CARE_PROVIDER_SITE_OTHER): Payer: BC Managed Care – PPO | Admitting: Obstetrics and Gynecology

## 2021-12-16 ENCOUNTER — Other Ambulatory Visit (HOSPITAL_COMMUNITY)
Admission: RE | Admit: 2021-12-16 | Discharge: 2021-12-16 | Disposition: A | Discharge status: Home / Self Care | Payer: BC Managed Care – PPO | Source: Ambulatory Visit | Attending: Obstetrics and Gynecology | Admitting: Obstetrics and Gynecology

## 2021-12-16 ENCOUNTER — Other Ambulatory Visit: Payer: Self-pay

## 2021-12-16 ENCOUNTER — Encounter: Payer: Self-pay | Admitting: Obstetrics and Gynecology

## 2021-12-16 VITALS — BP 128/70 | Ht 67.0 in | Wt 323.0 lb

## 2021-12-16 DIAGNOSIS — N939 Abnormal uterine and vaginal bleeding, unspecified: Secondary | ICD-10-CM

## 2021-12-16 MED ORDER — MEGESTROL ACETATE 40 MG PO TABS: 40.0000 mg | ORAL_TABLET | Freq: Two times a day (BID) | ORAL | 5 refills | Status: AC

## 2021-12-16 NOTE — Progress Notes (Signed)
41 yo Katrina Stout referred for the evaluation of abnormal uterine bleeding. Patient reports a history of a monthly 4-day period but over the past 6 months her periods have become irregular and heavy in flow with passage of blood clots. Patient states she saturates a pad every 2 hours on her heavier days. She states that on a good month, she may have 1-2 weeks without vaginal bleeding. Patient is without any other complaints. She denies dysmenorrhea. She is not sexually active. She reports regular bowel movement ? ?Past Medical History:  ?Diagnosis Date  ? Anemia   ? CHF (congestive heart failure) ([facility])   ? COPD (chronic obstructive pulmonary disease) ([facility])   ? Eczema   ? H/O medication noncompliance   ? Hypertension   ? Morbid obesity ([facility])   ? ?Past Surgical History:  ?Procedure Laterality Date  ? TYMPANOSTOMY TUBE PLACEMENT    ? ?Family History  ?Problem Relation Age of Onset  ? Heart attack Mother   ? Hypertension Father   ? Heart attack Maternal Grandmother   ? Heart failure Maternal Grandmother   ? ?Social History  ? ?Tobacco Use  ? Smoking status: Every Day  ?  Packs/day: 1.00  ?  Types: Cigarettes  ? Smokeless tobacco: Never  ?Substance Use Topics  ? Alcohol use: No  ? Drug use: Yes  ?  Types: Marijuana  ? ?ROS ?See pertinent in HPI. All other systems reviewed and non contributory ?Blood pressure 128/70, height 5\' 7"  (1.702 m), weight (!) 323 lb (146.5 kg), last menstrual period 12/13/2021, unknown if currently breastfeeding. ?GENERAL: Well-developed, well-nourished female in no acute distress. Obese ?HEENT: Normocephalic, atraumatic. Sclerae anicteric.  ?NECK: Supple. Normal thyroid.  ?LUNGS: Clear to auscultation bilaterally.  ?HEART: Regular rate and rhythm. ?BREASTS: Symmetric in size. No palpable masses or lymphadenopathy, skin changes, or nipple drainage. ?ABDOMEN: Soft, nontender, nondistended. No organomegaly. ?PELVIC: Normal external female genitalia. Vagina is pink and rugated.  Normal discharge. Normal  appearing cervix. Uterus is normal in size. No adnexal mass or tenderness. Chaperone present during the pelvic exam ?EXTREMITIES: No cyanosis, clubbing, or edema, 2+ distal pulses. ? ?A/P 41 yo with abnormal uterine bleeding ?- Pelvic ultrasound ordered ?- Discussed medical management with hormonal contraception vs endometrial ablation pending endometrial biopsy- Information on both provided ?- Rx megace given ?- Discussed benefits of endometrial biopsy ?ENDOMETRIAL BIOPSY     ?The indications for endometrial biopsy were reviewed.   Risks of the biopsy including cramping, bleeding, infection, uterine perforation, inadequate specimen and need for additional procedures  were discussed. The patient states she understands and agrees to undergo procedure today. Consent was signed. Time out was performed. Urine HCG was negative. ?A sterile speculum was placed in the patient's vagina and the cervix was prepped with Betadine. A single-toothed tenaculum was placed on the anterior lip of the cervix to stabilize it. The uterine cavity was sounded to a depth of 9 cm using the uterine sound. The 3 mm pipelle was introduced into the endometrial cavity without difficulty, 2 passes were made.  A  moderate amount of tissue was  sent to pathology. The instruments were removed from the patient's vagina. Minimal bleeding from the cervix was noted. The patient tolerated the procedure well.  Routine post-procedure instructions were given to the patient. The patient will follow up in two weeks to review the results and for further management.  ? ?- Patient reports normal pap smear with PCP within the past 2 weeks ?- Patient still needs to schedule  her mammogram ?

## 2021-12-18 LAB — SURGICAL PATHOLOGY

## 2021-12-24 ENCOUNTER — Ambulatory Visit
Admission: RE | Admit: 2021-12-24 | Discharge: 2021-12-24 | Disposition: A | Discharge status: Home / Self Care | Payer: BC Managed Care – PPO | Source: Ambulatory Visit | Attending: Obstetrics and Gynecology | Admitting: Obstetrics and Gynecology

## 2021-12-24 DIAGNOSIS — N939 Abnormal uterine and vaginal bleeding, unspecified: Secondary | ICD-10-CM | POA: Diagnosis present

## 2023-01-31 IMAGING — US US PELVIS COMPLETE WITH TRANSVAGINAL
1 series · 13 of 25 positions shown · non-contrast
Comparison: None.

CLINICAL DATA: Abnormal uterine bleeding

EXAM:
TRANSABDOMINAL AND TRANSVAGINAL ULTRASOUND OF PELVIS
DOPPLER ULTRASOUND OF OVARIES
TECHNIQUE: Both transabdominal and transvaginal ultrasound examinations of the
pelvis were performed. Transabdominal technique was performed for
global imaging of the pelvis including uterus, ovaries, adnexal
regions, and pelvic cul-de-sac.
It was necessary to proceed with endovaginal exam following the
transabdominal exam to visualize the endometrium and ovaries. Color
and duplex Doppler ultrasound was utilized to evaluate blood flow to
the ovaries.

[Series 1: us pelvic complete with transvaginal · 13 of 65 slices shown]
[im 1/65]
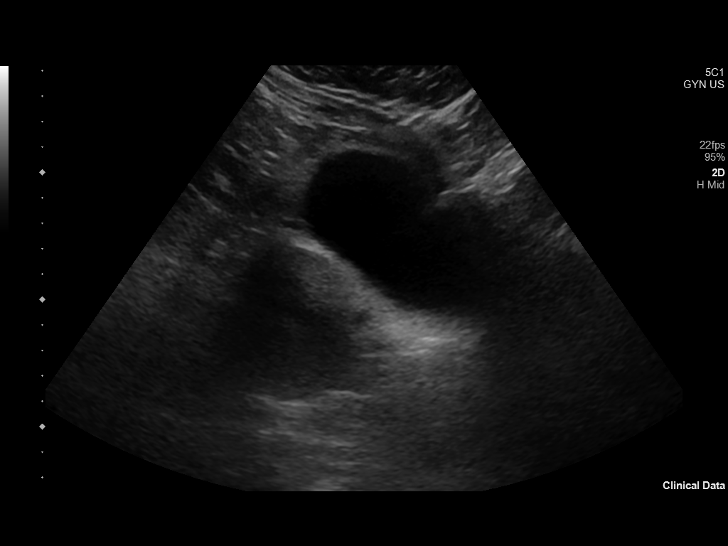
[im 6/65]
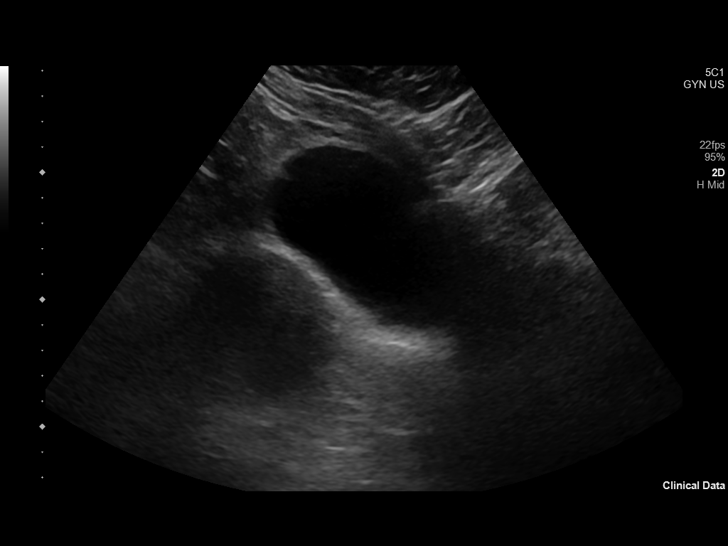
[im 11/65]
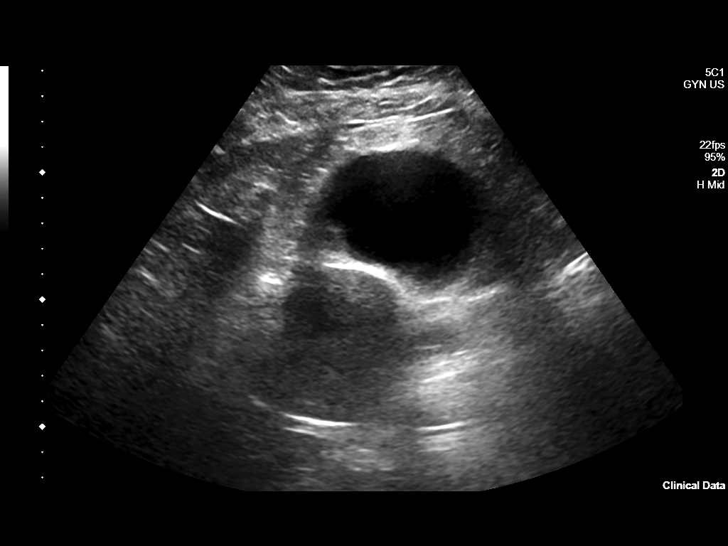
[im 17/65]
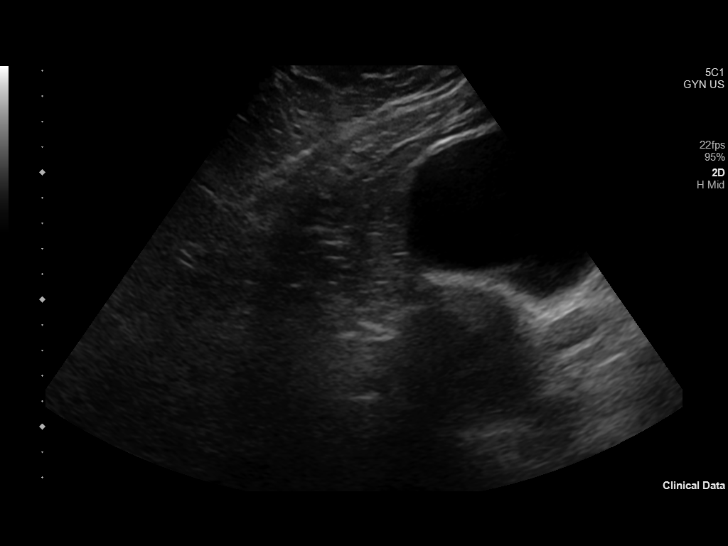
[im 22/65]
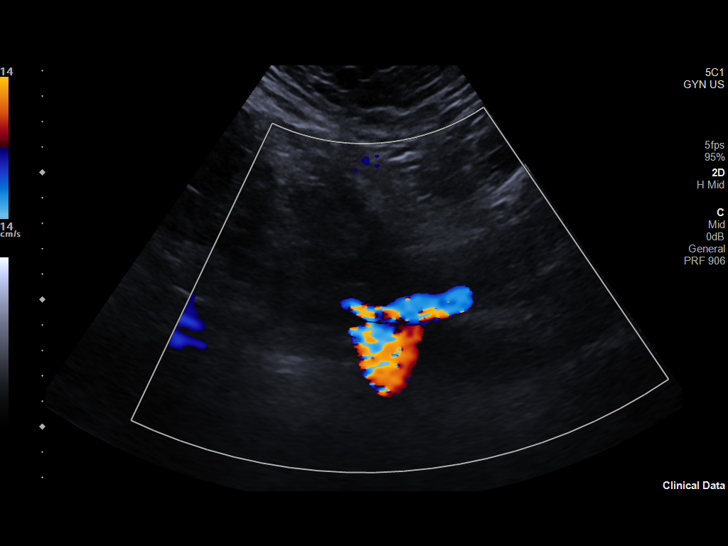
[im 27/65]
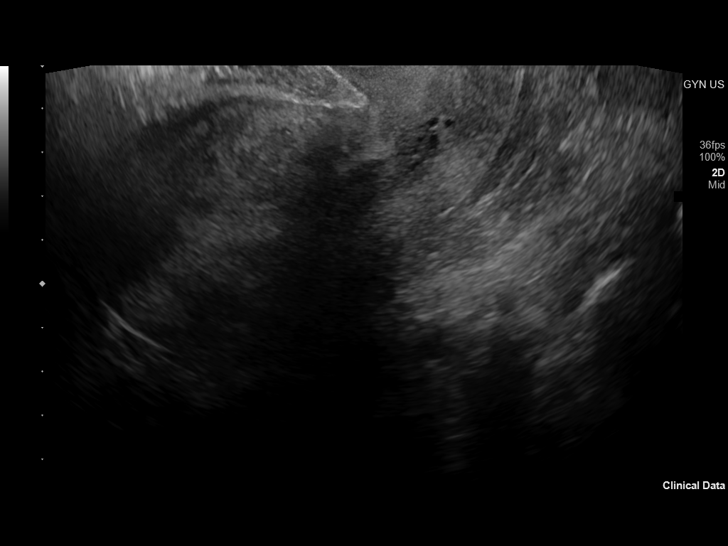
[im 33/65]
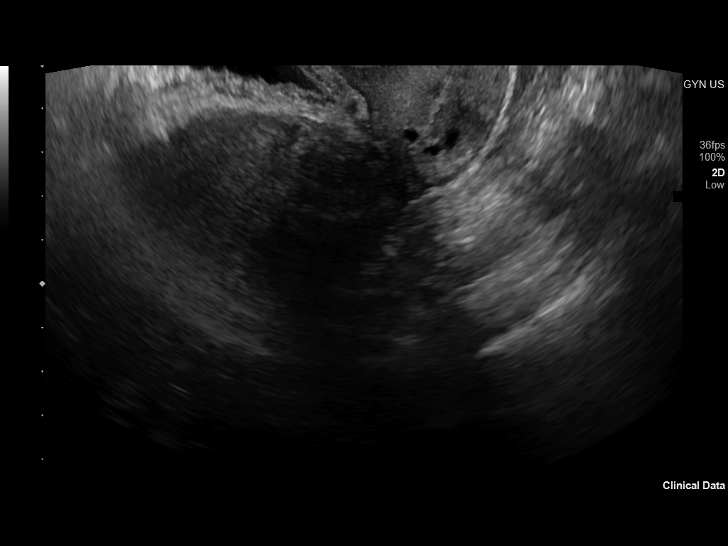
[im 38/65]
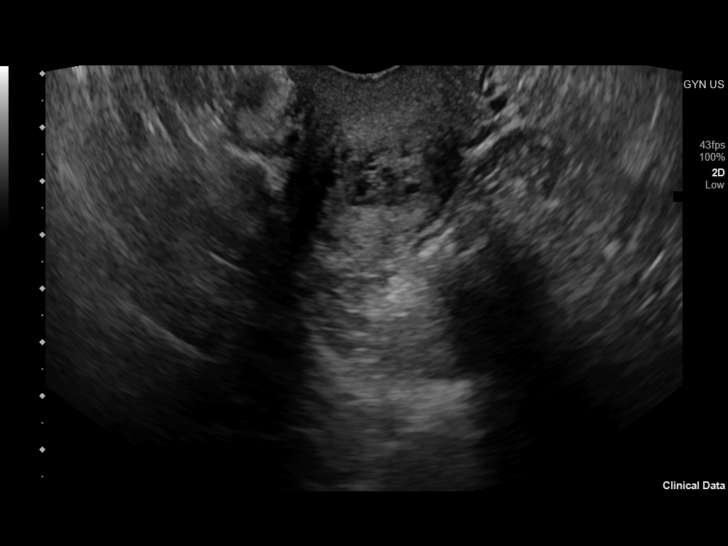
[im 43/65]
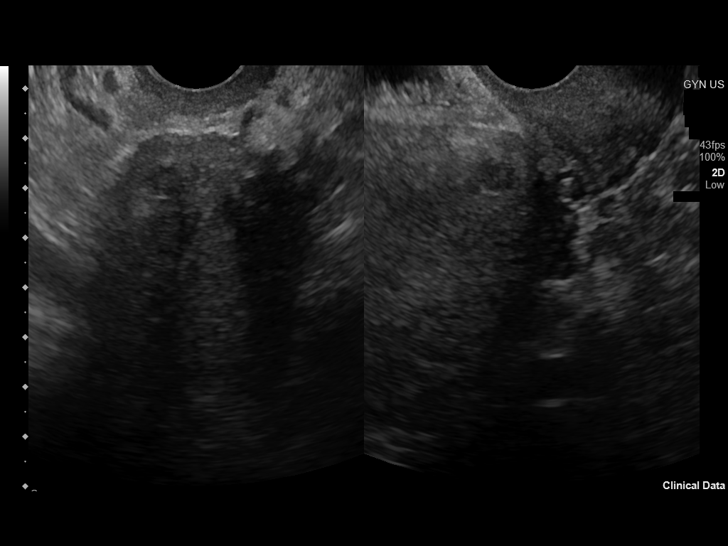
[im 49/65]
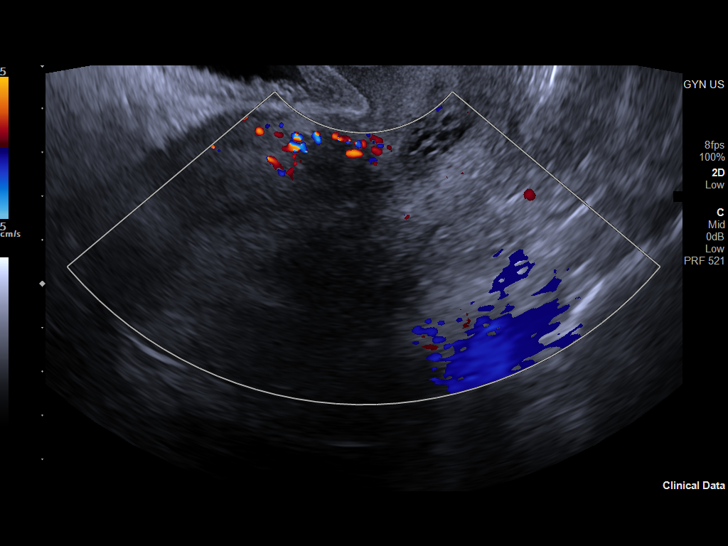
[im 54/65]
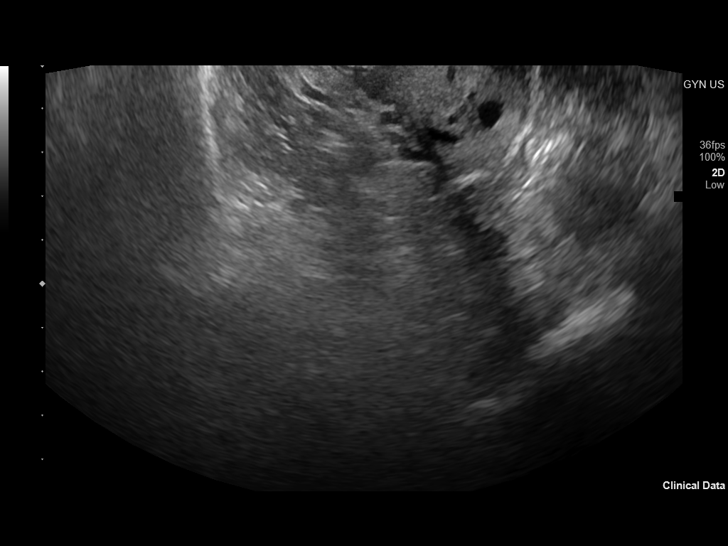
[im 59/65]
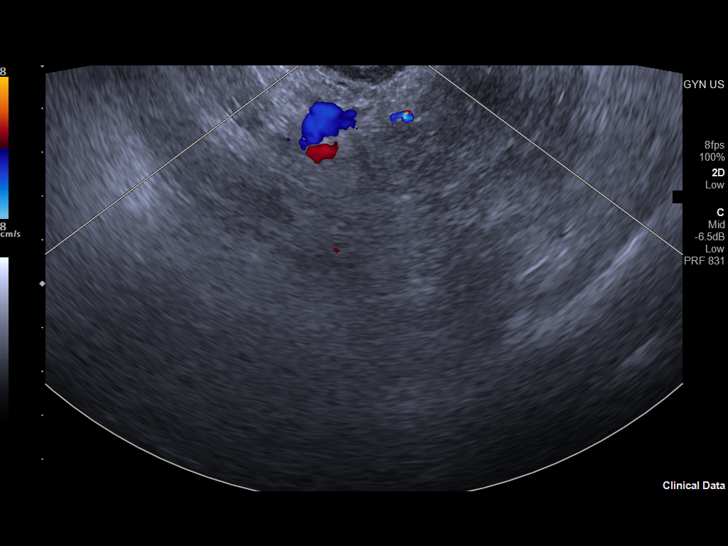
[im 65/65]
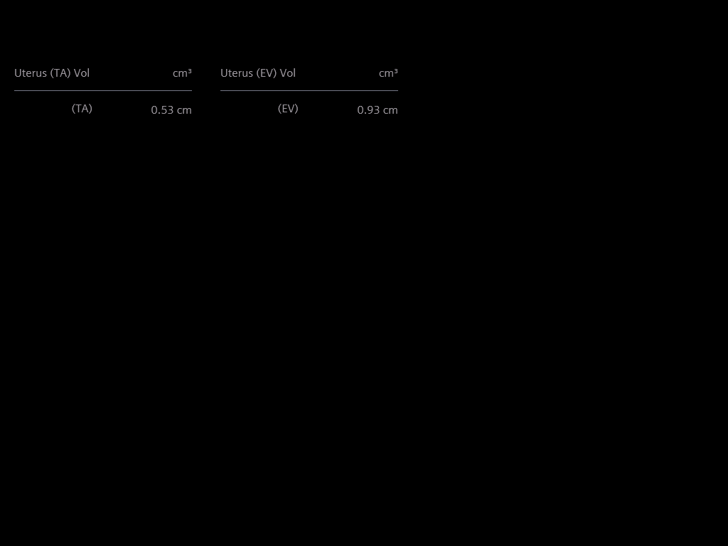

[13 of 25 positions shown; findings below may reference images not displayed]

FINDINGS: Uterus

Measurements: 11.1 x 6.1 x 5.2 cm = volume: 184 mL. There is 10 mm
hypoechoic structure in the anterior lower uterine segment possibly
small fibroid. There is inhomogeneous echogenicity in myometrium.

Endometrium

Thickness: 9 mm.  No focal abnormality visualized.

Right ovary

Not sonographically visualized.

Left ovary

Not sonographically visualized.

Other findings

No abnormal free fluid.
IMPRESSION: There is inhomogeneous echogenicity in myometrium with possible 10
mm fibroid in the lower uterine segment.

Ovaries are not sonographically visualized. There are no dominant
adnexal masses. There is no free fluid in the pelvis.

## 2023-04-14 ENCOUNTER — Other Ambulatory Visit: Payer: Self-pay | Admitting: Family Medicine

## 2023-04-14 DIAGNOSIS — Z1231 Encounter for screening mammogram for malignant neoplasm of breast: Secondary | ICD-10-CM

## 2023-05-27 ENCOUNTER — Ambulatory Visit
Admission: RE | Admit: 2023-05-27 | Discharge: 2023-05-27 | Disposition: A | Discharge status: Home / Self Care | Payer: BC Managed Care – PPO | Source: Ambulatory Visit | Attending: Family Medicine | Admitting: Family Medicine

## 2023-05-27 DIAGNOSIS — Z1231 Encounter for screening mammogram for malignant neoplasm of breast: Secondary | ICD-10-CM | POA: Insufficient documentation

## 2023-12-22 ENCOUNTER — Other Ambulatory Visit: Payer: Self-pay | Admitting: Family Medicine

## 2023-12-22 DIAGNOSIS — Z1231 Encounter for screening mammogram for malignant neoplasm of breast: Secondary | ICD-10-CM

## 2024-05-29 ENCOUNTER — Ambulatory Visit
Admission: RE | Admit: 2024-05-29 | Discharge: 2024-05-29 | Disposition: A | Discharge status: Home / Self Care | Source: Ambulatory Visit | Attending: Family Medicine | Admitting: Family Medicine

## 2024-05-29 DIAGNOSIS — Z1231 Encounter for screening mammogram for malignant neoplasm of breast: Secondary | ICD-10-CM | POA: Diagnosis present
